# Patient Record
Sex: Male | Born: 1955 | Race: White | Hispanic: No | Marital: Single | Smoking: Former smoker
Health system: Southern US, Community
[De-identification: ages and names within clinical notes are randomized; demographics above are authoritative.]

## PROBLEM LIST (undated history)

## (undated) DIAGNOSIS — N289 Disorder of kidney and ureter, unspecified: Secondary | ICD-10-CM

## (undated) DIAGNOSIS — M459 Ankylosing spondylitis of unspecified sites in spine: Secondary | ICD-10-CM

## (undated) HISTORY — PX: OTHER SURGICAL HISTORY: SHX169

---

## 2005-07-25 ENCOUNTER — Ambulatory Visit (HOSPITAL_COMMUNITY): Admission: RE | Admit: 2005-07-25 | Discharge: 2005-07-25 | Payer: Self-pay | Admitting: Urology

## 2005-08-02 ENCOUNTER — Ambulatory Visit (HOSPITAL_COMMUNITY): Admission: RE | Admit: 2005-08-02 | Discharge: 2005-08-02 | Payer: Self-pay | Admitting: Urology

## 2010-07-10 ENCOUNTER — Emergency Department (HOSPITAL_COMMUNITY)
Admission: EM | Admit: 2010-07-10 | Discharge: 2010-07-10 | Disposition: A | Discharge status: Home / Self Care | Payer: Self-pay | Attending: Emergency Medicine | Admitting: Emergency Medicine

## 2010-07-10 ENCOUNTER — Emergency Department (HOSPITAL_COMMUNITY): Payer: Self-pay

## 2010-07-10 DIAGNOSIS — R059 Cough, unspecified: Secondary | ICD-10-CM | POA: Insufficient documentation

## 2010-07-10 DIAGNOSIS — G8929 Other chronic pain: Secondary | ICD-10-CM | POA: Insufficient documentation

## 2010-07-10 DIAGNOSIS — R002 Palpitations: Secondary | ICD-10-CM | POA: Insufficient documentation

## 2010-07-10 DIAGNOSIS — R42 Dizziness and giddiness: Secondary | ICD-10-CM | POA: Insufficient documentation

## 2010-07-10 DIAGNOSIS — R05 Cough: Secondary | ICD-10-CM | POA: Insufficient documentation

## 2010-07-10 DIAGNOSIS — R079 Chest pain, unspecified: Secondary | ICD-10-CM | POA: Insufficient documentation

## 2010-07-10 DIAGNOSIS — M549 Dorsalgia, unspecified: Secondary | ICD-10-CM | POA: Insufficient documentation

## 2010-07-10 DIAGNOSIS — F411 Generalized anxiety disorder: Secondary | ICD-10-CM | POA: Insufficient documentation

## 2010-07-10 LAB — BASIC METABOLIC PANEL
BUN: 14 mg/dL (ref 6–23)
CO2: 25 mEq/L (ref 19–32)
Chloride: 106 mEq/L (ref 96–112)
GFR calc Af Amer: >60 mL/min (ref >60)
GFR calc non Af Amer: >60 mL/min (ref >60)
Glucose, Bld: 92 mg/dL (ref 70–99)
Potassium: 3.7 mEq/L (ref 3.5–5.1)
Sodium: 138 mEq/L (ref 135–145)

## 2010-07-10 LAB — CBC
HCT: 45.5 % (ref 39.0–52.0)
MCV: 85.7 fL (ref 78.0–100.0)
Platelets: 237 10*3/uL (ref 150–400)
RDW: 13.7 % (ref 11.5–15.5)

## 2010-07-10 LAB — DIFFERENTIAL
Basophils Absolute: 0 10*3/uL (ref 0.0–0.1)
Eosinophils Absolute: 0.1 10*3/uL (ref 0.0–0.7)
Eosinophils Relative: 1 % (ref 0–5)
Lymphocytes Relative: 34 % (ref 12–46)
Monocytes Relative: 10 % (ref 3–12)
Neutrophils Relative %: 55 % (ref 43–77)

## 2010-07-10 LAB — POCT CARDIAC MARKERS: Troponin i, poc: <0.05 ng/mL (ref 0.00–0.09)

## 2010-07-20 ENCOUNTER — Emergency Department (HOSPITAL_COMMUNITY): Payer: Self-pay

## 2010-07-20 ENCOUNTER — Emergency Department (HOSPITAL_COMMUNITY)
Admission: EM | Admit: 2010-07-20 | Discharge: 2010-07-20 | Disposition: A | Discharge status: Home / Self Care | Payer: Self-pay | Attending: Emergency Medicine | Admitting: Emergency Medicine

## 2010-07-20 DIAGNOSIS — R112 Nausea with vomiting, unspecified: Secondary | ICD-10-CM | POA: Insufficient documentation

## 2010-07-20 DIAGNOSIS — Z79899 Other long term (current) drug therapy: Secondary | ICD-10-CM | POA: Insufficient documentation

## 2010-07-20 DIAGNOSIS — N23 Unspecified renal colic: Secondary | ICD-10-CM | POA: Insufficient documentation

## 2010-07-20 LAB — URINALYSIS, ROUTINE W REFLEX MICROSCOPIC
Bilirubin Urine: NEGATIVE
Ketones, ur: NEGATIVE mg/dL
Urobilinogen, UA: 0.2 mg/dL (ref 0.0–1.0)
pH: 5.5 (ref 5.0–8.0)

## 2010-07-20 LAB — BASIC METABOLIC PANEL
BUN: 16 mg/dL (ref 6–23)
Calcium: 9.6 mg/dL (ref 8.4–10.5)
Chloride: 104 mEq/L (ref 96–112)
Creatinine, Ser: 1.52 mg/dL — ABNORMAL HIGH (ref 0.4–1.5)
GFR calc non Af Amer: 48 mL/min — ABNORMAL LOW (ref >60)
Glucose, Bld: 99 mg/dL (ref 70–99)

## 2010-07-20 LAB — DIFFERENTIAL
Eosinophils Relative: 1 % (ref 0–5)
Monocytes Absolute: 1.4 10*3/uL — ABNORMAL HIGH (ref 0.1–1.0)
Neutrophils Relative %: 65 % (ref 43–77)

## 2010-07-20 LAB — CBC
HCT: 42.4 % (ref 39.0–52.0)
MCV: 85 fL (ref 78.0–100.0)
RBC: 4.99 MIL/uL (ref 4.22–5.81)

## 2010-07-20 LAB — URINE MICROSCOPIC-ADD ON

## 2015-04-02 ENCOUNTER — Encounter (HOSPITAL_COMMUNITY): Payer: Self-pay | Admitting: Emergency Medicine

## 2015-04-02 ENCOUNTER — Emergency Department (HOSPITAL_COMMUNITY): Payer: Medicare Other

## 2015-04-02 ENCOUNTER — Observation Stay (HOSPITAL_COMMUNITY)
Admission: EM | Admit: 2015-04-02 | Discharge: 2015-04-03 | Disposition: A | Discharge status: Home / Self Care | Payer: Medicare Other | Attending: Internal Medicine | Admitting: Internal Medicine

## 2015-04-02 DIAGNOSIS — G8929 Other chronic pain: Secondary | ICD-10-CM | POA: Insufficient documentation

## 2015-04-02 DIAGNOSIS — Z87442 Personal history of urinary calculi: Secondary | ICD-10-CM | POA: Insufficient documentation

## 2015-04-02 DIAGNOSIS — F329 Major depressive disorder, single episode, unspecified: Secondary | ICD-10-CM | POA: Diagnosis not present

## 2015-04-02 DIAGNOSIS — F32A Depression, unspecified: Secondary | ICD-10-CM | POA: Diagnosis present

## 2015-04-02 DIAGNOSIS — F1010 Alcohol abuse, uncomplicated: Secondary | ICD-10-CM | POA: Diagnosis present

## 2015-04-02 DIAGNOSIS — G4733 Obstructive sleep apnea (adult) (pediatric): Secondary | ICD-10-CM | POA: Diagnosis not present

## 2015-04-02 DIAGNOSIS — J189 Pneumonia, unspecified organism: Principal | ICD-10-CM | POA: Diagnosis present

## 2015-04-02 DIAGNOSIS — Z888 Allergy status to other drugs, medicaments and biological substances status: Secondary | ICD-10-CM | POA: Insufficient documentation

## 2015-04-02 DIAGNOSIS — M549 Dorsalgia, unspecified: Secondary | ICD-10-CM | POA: Insufficient documentation

## 2015-04-02 DIAGNOSIS — Z87891 Personal history of nicotine dependence: Secondary | ICD-10-CM | POA: Insufficient documentation

## 2015-04-02 HISTORY — DX: Disorder of kidney and ureter, unspecified: N28.9

## 2015-04-02 HISTORY — DX: Ankylosing spondylitis of unspecified sites in spine: M45.9

## 2015-04-02 LAB — COMPREHENSIVE METABOLIC PANEL
ALT: 20 U/L (ref 17–63)
ANION GAP: 7 (ref 5–15)
AST: 20 U/L (ref 15–41)
Albumin: 2.9 g/dL — ABNORMAL LOW (ref 3.5–5.0)
Alkaline Phosphatase: 56 U/L (ref 38–126)
BUN: 16 mg/dL (ref 6–20)
CHLORIDE: 103 mmol/L (ref 101–111)
CO2: 29 mmol/L (ref 22–32)
CREATININE: 0.9 mg/dL (ref 0.61–1.24)
Calcium: 8.4 mg/dL — ABNORMAL LOW (ref 8.9–10.3)
Glucose, Bld: 104 mg/dL — ABNORMAL HIGH (ref 65–99)
POTASSIUM: 3.6 mmol/L (ref 3.5–5.1)
SODIUM: 139 mmol/L (ref 135–145)
Total Bilirubin: 0.5 mg/dL (ref 0.3–1.2)
Total Protein: 6.3 g/dL — ABNORMAL LOW (ref 6.5–8.1)

## 2015-04-02 LAB — RAPID URINE DRUG SCREEN, HOSP PERFORMED
Amphetamines: NOT DETECTED
BARBITURATES: NOT DETECTED
BENZODIAZEPINES: POSITIVE — AB
COCAINE: NOT DETECTED
Opiates: POSITIVE — AB
TETRAHYDROCANNABINOL: NOT DETECTED

## 2015-04-02 LAB — CBC WITH DIFFERENTIAL/PLATELET
Basophils Absolute: 0 10*3/uL (ref 0.0–0.1)
Basophils Relative: 0 %
EOS ABS: 0.1 10*3/uL (ref 0.0–0.7)
EOS PCT: 1 %
HCT: 31.6 % — ABNORMAL LOW (ref 39.0–52.0)
Hemoglobin: 10.3 g/dL — ABNORMAL LOW (ref 13.0–17.0)
LYMPHS ABS: 1.9 10*3/uL (ref 0.7–4.0)
LYMPHS PCT: 19 %
MCH: 28.1 pg (ref 26.0–34.0)
MCHC: 32.6 g/dL (ref 30.0–36.0)
MCV: 86.3 fL (ref 78.0–100.0)
MONO ABS: 1.1 10*3/uL — AB (ref 0.1–1.0)
MONOS PCT: 11 %
Neutro Abs: 6.7 10*3/uL (ref 1.7–7.7)
Neutrophils Relative %: 69 %
PLATELETS: 176 10*3/uL (ref 150–400)
RBC: 3.66 MIL/uL — AB (ref 4.22–5.81)
RDW: 13.4 % (ref 11.5–15.5)
WBC: 9.8 10*3/uL (ref 4.0–10.5)

## 2015-04-02 LAB — BLOOD GAS, ARTERIAL
Acid-Base Excess: 3 mmol/L — ABNORMAL HIGH (ref 0.0–2.0)
BICARBONATE: 26.8 meq/L — AB (ref 20.0–24.0)
Drawn by: 23534
FIO2: 0.21
O2 Content: 21 L/min
O2 Saturation: 92.1 %
PCO2 ART: 43.9 mmHg (ref 35.0–45.0)
PH ART: 7.41 (ref 7.350–7.450)
pO2, Arterial: 64.5 mmHg — ABNORMAL LOW (ref 80.0–100.0)

## 2015-04-02 MED ORDER — ENOXAPARIN SODIUM 40 MG/0.4ML ~~LOC~~ SOLN
40.0000 mg | SUBCUTANEOUS | Status: DC
  Administered 2015-04-02: 40 mg via SUBCUTANEOUS
  Filled 2015-04-02: qty 0.4

## 2015-04-02 MED ORDER — BUSPIRONE HCL 5 MG PO TABS
10.0000 mg | ORAL_TABLET | Freq: Three times a day (TID) | ORAL | Status: DC
Start: 2015-04-02 — End: 2015-04-03
  Filled 2015-04-02 (×6): qty 1

## 2015-04-02 MED ORDER — LEVOFLOXACIN IN D5W 500 MG/100ML IV SOLN
500.0000 mg | Freq: Once | INTRAVENOUS | Status: AC
  Administered 2015-04-02: 500 mg via INTRAVENOUS
  Filled 2015-04-02: qty 100

## 2015-04-02 MED ORDER — THIAMINE HCL 100 MG/ML IJ SOLN: 100.0000 mg | Freq: Every day | INTRAMUSCULAR | Status: DC

## 2015-04-02 MED ORDER — AZITHROMYCIN 250 MG PO TABS: 500.0000 mg | ORAL_TABLET | Freq: Once | ORAL | Status: DC

## 2015-04-02 MED ORDER — LORAZEPAM 1 MG PO TABS
0.0000 mg | ORAL_TABLET | Freq: Four times a day (QID) | ORAL | Status: DC
  Administered 2015-04-02 – 2015-04-03 (×4): 1 mg via ORAL
  Filled 2015-04-02 (×3): qty 1

## 2015-04-02 MED ORDER — LORAZEPAM 1 MG PO TABS: 0.0000 mg | ORAL_TABLET | Freq: Two times a day (BID) | ORAL | Status: DC

## 2015-04-02 MED ORDER — VITAMIN B-1 100 MG PO TABS
100.0000 mg | ORAL_TABLET | Freq: Every day | ORAL | Status: DC
  Administered 2015-04-02 – 2015-04-03 (×2): 100 mg via ORAL
  Filled 2015-04-02 (×2): qty 1

## 2015-04-02 MED ORDER — LORAZEPAM 1 MG PO TABS
1.0000 mg | ORAL_TABLET | Freq: Four times a day (QID) | ORAL | Status: DC | PRN
  Filled 2015-04-02: qty 1

## 2015-04-02 MED ORDER — ADULT MULTIVITAMIN W/MINERALS CH
1.0000 | ORAL_TABLET | Freq: Every day | ORAL | Status: DC
  Administered 2015-04-02 – 2015-04-03 (×2): 1 via ORAL
  Filled 2015-04-02 (×2): qty 1

## 2015-04-02 MED ORDER — LORAZEPAM 2 MG/ML IJ SOLN
1.0000 mg | Freq: Four times a day (QID) | INTRAMUSCULAR | Status: DC | PRN
Start: 2015-04-02 — End: 2015-04-03

## 2015-04-02 MED ORDER — ALBUTEROL SULFATE (2.5 MG/3ML) 0.083% IN NEBU: 2.5000 mg | INHALATION_SOLUTION | RESPIRATORY_TRACT | Status: AC | PRN

## 2015-04-02 MED ORDER — FOLIC ACID 1 MG PO TABS
1.0000 mg | ORAL_TABLET | Freq: Every day | ORAL | Status: DC
  Administered 2015-04-02 – 2015-04-03 (×2): 1 mg via ORAL
  Filled 2015-04-02 (×2): qty 1

## 2015-04-02 MED ORDER — OXYCODONE HCL 5 MG PO TABS
30.0000 mg | ORAL_TABLET | Freq: Four times a day (QID) | ORAL | Status: DC | PRN
Start: 2015-04-02 — End: 2015-04-03
  Administered 2015-04-02 – 2015-04-03 (×2): 30 mg via ORAL
  Filled 2015-04-02 (×2): qty 6

## 2015-04-02 NOTE — ED Provider Notes (Signed)
CSN: [id number]     Arrival date & time 04/02/15  [id number] History   First MD Initiated Contact with Patient 04/02/15 1011     Chief Complaint  Patient presents with  . Pneumonia     (Consider location/radiation/quality/duration/timing/severity/associated sxs/prior Treatment) Patient is a 60 y.o. male presenting with pneumonia. The history is provided by the patient. No language interpreter was used.  Pneumonia This is a new problem. The current episode started in the past 7 days. The problem occurs constantly. The problem has been gradually worsening. He has tried nothing for the symptoms. The treatment provided moderate relief.   Pt was admitted at [facility] on 1/12 due to pneumonia.  Pt has been on antibiotics.  Pt left today.  (AMA) Pt states he just decided to leave.  Pt has a history of substance abuse, alcohol abuse and mental illness.  Family brought pt here. Past Medical History  Diagnosis Date  . Renal disorder     kidney stones  . Ankylosing spondylitis (HCC)    History reviewed. No pertinent past surgical history. No family history on file. Social History  Substance Use Topics  . Smoking status: Former Smoker    Types: Cigarettes  . Smokeless tobacco: None  . Alcohol Use: No    Review of Systems  All other systems reviewed and are negative.     Allergies  Fentanyl  Home Medications   Prior to Admission medications   Not on File   BP 134/72 mmHg  Pulse 77  Temp(Src) 98.1 F (36.7 C) (Oral)  Resp 18  Ht 6' (1.829 m)  Wt 87.998 kg  BMI 26.31 kg/m2  SpO2 96% Physical Exam  Constitutional: He is oriented to person, place, and time. He appears well-developed and well-nourished.  HENT:  Head: Normocephalic.  Right Ear: External ear normal.  Left Ear: External ear normal.  Nose: Nose normal.  Mouth/Throat: Oropharynx is clear and moist.  Eyes: Conjunctivae and EOM are normal. Pupils are equal, round, and reactive to light.  Neck: Normal range of motion.  Neck supple.  Cardiovascular: Normal rate and normal heart sounds.   Pulmonary/Chest:  Rhonchi  No wheezing  Abdominal: Soft.  Musculoskeletal: Normal range of motion.  Neurological: He is alert and oriented to person, place, and time. He has normal reflexes.  Skin: Skin is warm.  Psychiatric: He has a normal mood and affect.  Nursing note and vitals reviewed.   ED Course  Procedures (including critical care time) Labs Review Labs Reviewed  CBC WITH DIFFERENTIAL/PLATELET - Abnormal; Notable for the following:    RBC 3.66 (*)    Hemoglobin 10.3 (*)    HCT 31.6 (*)    Monocytes Absolute 1.1 (*)    All other components within normal limits  COMPREHENSIVE METABOLIC PANEL - Abnormal; Notable for the following:    Glucose, Bld 104 (*)    Calcium 8.4 (*)    Total Protein 6.3 (*)    Albumin 2.9 (*)    All other components within normal limits    Imaging Review Dg Chest 2 View  04/02/2015  CLINICAL DATA:  Pt reports being diagnosed with pneumonia this morning at [facility]. Reports on-going productive cough. Denies SOB. Denies any pain EXAM: CHEST  2 VIEW COMPARISON:  Radiograph 03/31/2015 FINDINGS: Normal cardiac silhouette. There is patchy and nodular RIGHT upper lobe and RIGHT lower lobe airspace opacities which have worsened compared to prior. There is new nodularity in the RIGHT upper lobe. Mild LEFT perihilar airspace disease is  unchanged. IMPRESSION: Progressive nodular and patchy airspace disease the RIGHT lung suggest worsening pneumonia. Electronically Signed   By: Genevive Bi M.D.   On: 04/02/2015 11:15   I have personally reviewed and evaluated these images and lab results as part of my medical decision-making.   EKG Interpretation None      MDM Pt has worsening Pneumonia. Pt told nurse he was going home.   Records obtained from [facility] and reviewed.  Pt's family reports pt left ama because of psychiatric issues.  Family feels pt is worse.  After discussing with family  pt agrees to stay.   Final diagnoses:  Community acquired pneumonia    I discussed with Dr. Rito Ehrlich who will admit.    Lonia Skinner [place], PA-C 04/02/15 1417  Donnetta Hutching, MD 04/03/15 [id number]

## 2015-04-02 NOTE — H&P (Signed)
History and Physical  Jimmy Osborne WJX:[id number] DOB: 1956-01-25 DOA: 04/02/2015  Referring physician: Langston Masker, ER PA  PCP: No primary care provider on file.   Chief Complaint: Pneumonia   HPI: Jimmy Osborne is a 60 y.o. male  Past medical history of cigarette of alcohol abuse and chronic back pain who was admitted at [facility] on 1/12 for pneumonia. Patient became angry and left AMA after 1 day of treatment. He came here to the emergency room today 1/14 after family felt like his breathing was labored. Chest x-ray compared to film done on admission there noted worsening pneumonia, although oxygen saturations at 95% on room air and the labs including white blood cell count normal. Hospitalist call for further evaluation   Review of Systems:  Patient seen after arrival to floor . Pt denies any complaints other than chronic back pain.  Pt denies any headaches, vision changes, dysphagia, chest pain, palpitations, shortness of breath, wheeze, cough, abdominal pain, hematuria, dysuria, constipation, diarrhea, focal extremity numbness weakness or pain .  Review of systems are otherwise negative  Past Medical History  Diagnosis Date  . Renal disorder     kidney stones  . Ankylosing spondylitis (HCC)    History reviewed. No pertinent past surgical history. Social History:  reports that he has quit smoking. His smoking use included Cigarettes. He does not have any smokeless tobacco history on file. He reports that he does not drink alcohol or use illicit drugs. patient's wife privately told me that he drinks quite heavily and drinks several bottles of wine a day. His last drink was late Thursday. Patient lives at home with his wife and children  & is able to participate in activities of daily living without use of the walker or cane  Allergies  Allergen Reactions  . Fentanyl Other (See Comments)    jittery    Family history: Depression  Prior to Admission medications     Medication Sig Start Date End Date Taking? Authorizing Provider  busPIRone (BUSPAR) 10 MG tablet Take 10 mg by mouth 3 (three) times daily.   Yes Historical Provider, MD  Fish Oil-Cholecalciferol (FISH OIL + D3 PO) Take 2 capsules by mouth daily.   Yes Historical Provider, MD  hydrOXYzine (VISTARIL) 50 MG capsule Take 50 mg by mouth 3 (three) times daily as needed.   Yes Historical Provider, MD  Multiple Vitamin (MULTIVITAMIN WITH MINERALS) TABS tablet Take 1 tablet by mouth daily.   Yes Historical Provider, MD  oxycodone (ROXICODONE) 30 MG immediate release tablet Take 30 mg by mouth every 6 (six) hours as needed for pain.   Yes Historical Provider, MD  vitamin C (ASCORBIC ACID) 500 MG tablet Take 500 mg by mouth daily.   Yes Historical Provider, MD    Physical Exam: BP 116/66 mmHg  Pulse 74  Temp(Src) 98.2 F (36.8 C) (Oral)  Resp 20  Ht 6' (1.829 m)  Wt 93.441 kg (206 lb)  BMI 27.93 kg/m2  SpO2 95%  General:  Alert and oriented 3, although at times nods off Eyes: Sclera nonicteric, extraocular movements are intact  ENT: Normocephalic, atraumatic, mucous memories are dry  Neck: No JVD  Cardiovascular: Regular rate and rhythm, S1-S2  Respiratory: Decreased breath sounds bibasilar, with prolonged expiratory phase  Abdomen: Soft, nontender, nondistended, positive bowel sounds  Skin: No skin breaks, tears or lesions  Musculoskeletal: No clubbing or cyanosis, trace edema  Psychiatric: Patient is appropriate, no evidence of psychoses  Neurologic: No focal deficits  Labs on Admission:  Basic Metabolic Panel:  Recent Labs Lab 04/02/15 1044  NA 139  K 3.6  CL 103  CO2 29  GLUCOSE 104*  BUN 16  CREATININE 0.90  CALCIUM 8.4*   Liver Function Tests:  Recent Labs Lab 04/02/15 1044  AST 20  ALT 20  ALKPHOS 56  BILITOT 0.5  PROT 6.3*  ALBUMIN 2.9*   No results for input(s): LIPASE, AMYLASE in the last 168 hours. No results for input(s): AMMONIA in the last  168 hours. CBC:  Recent Labs Lab 04/02/15 1044  WBC 9.8  NEUTROABS 6.7  HGB 10.3*  HCT 31.6*  MCV 86.3  PLT 176   Cardiac Enzymes: No results for input(s): CKTOTAL, CKMB, CKMBINDEX, TROPONINI in the last 168 hours.  BNP (last 3 results) No results for input(s): BNP in the last 8760 hours.  ProBNP (last 3 results) No results for input(s): PROBNP in the last 8760 hours.  CBG: No results for input(s): GLUCAP in the last 168 hours.  Radiological Exams on Admission: Dg Chest 2 View  04/02/2015  CLINICAL DATA:  Pt reports being diagnosed with pneumonia this [facility] at [facility]. Reports on-going productive cough. Denies SOB. Denies any pain EXAM: CHEST  2 VIEW COMPARISON:  Radiograph 03/31/2015 FINDINGS: Normal cardiac silhouette. There is patchy and nodular RIGHT upper lobe and RIGHT lower lobe airspace opacities which have worsened compared to prior. There is new nodularity in the RIGHT upper lobe. Mild LEFT perihilar airspace disease is unchanged. IMPRESSION: Progressive nodular and patchy airspace disease the RIGHT lung suggest worsening pneumonia. Electronically Signed   By: Genevive BiStewart  Edmunds M.D.   On: 04/02/2015 11:15    EKG: Not done  Assessment/Plan Present on Admission:  . CAP (community acquired pneumonia): Worsening finding on x-ray likely more of a delay. Nevertheless, we'll try Levaquin. The patient truly is not alcoholic many of this is aspiration, Levaquin should cover this as well. Oxygen saturation stable. Given that we are continuing his home pain medications plus Ativan protocol, continuous pulse ox. Check ABG given concerns of confusion  . OSA (obstructive sleep apnea): Patient at times will sleep and then gasping when he stops breathing. Sounds very much like classic undiagnosed sleep apnea. Refer for sleep study. Patient amenable to trying CPAP here.  . Alcohol abuse: According to patient's wife, patient drinks heavily several bottles of wine a day. His last drink  was late Thursday evening. Will actively plan for protocol. CIWA . Given reports of potential other abuse of drugs, will check urine drug screen  . Depression: Continue BuSpar   Chronic back pain: Continue home medications  Consultants: None    Code Status: Full code    Family Communication: Wife at the bedside     Disposition Plan: Potential discharge in next few days    Time spent: 40 minutes    Hollice EspyKRISHNAN,SENDIL K [facility] Pager [phone]

## 2015-04-02 NOTE — ED Notes (Signed)
Pt reports being [facility] with [facility] at [facility]. Pt states he left AMA due to feeling like they were not caring for him appropriately. Reports on-going productive cough. Denies SOB. Denies any pain.

## 2015-04-02 NOTE — Progress Notes (Signed)
Placed patient on nasal mask ,CPAP 9 auto titrate. Room air. Appears to tolerate well.

## 2015-04-02 NOTE — ED Notes (Signed)
Report given to floor nurse, all questions answered  

## 2015-04-03 DIAGNOSIS — F1010 Alcohol abuse, uncomplicated: Secondary | ICD-10-CM

## 2015-04-03 DIAGNOSIS — J189 Pneumonia, unspecified organism: Secondary | ICD-10-CM | POA: Diagnosis not present

## 2015-04-03 LAB — CBC
HCT: 32.1 % — ABNORMAL LOW (ref 39.0–52.0)
Hemoglobin: 10.7 g/dL — ABNORMAL LOW (ref 13.0–17.0)
MCH: 28.2 pg (ref 26.0–34.0)
MCHC: 33.3 g/dL (ref 30.0–36.0)
MCV: 84.7 fL (ref 78.0–100.0)
PLATELETS: 195 10*3/uL (ref 150–400)
RBC: 3.79 MIL/uL — ABNORMAL LOW (ref 4.22–5.81)
RDW: 13.2 % (ref 11.5–15.5)
WBC: 8.1 10*3/uL (ref 4.0–10.5)

## 2015-04-03 LAB — BASIC METABOLIC PANEL
ANION GAP: 7 (ref 5–15)
BUN: 11 mg/dL (ref 6–20)
CALCIUM: 8.4 mg/dL — AB (ref 8.9–10.3)
CO2: 28 mmol/L (ref 22–32)
Chloride: 104 mmol/L (ref 101–111)
Creatinine, Ser: 0.76 mg/dL (ref 0.61–1.24)
GFR calc Af Amer: >60 mL/min (ref >60)
GLUCOSE: 119 mg/dL — AB (ref 65–99)
Potassium: 3.2 mmol/L — ABNORMAL LOW (ref 3.5–5.1)
Sodium: 139 mmol/L (ref 135–145)

## 2015-04-03 LAB — STREP PNEUMONIAE URINARY ANTIGEN: STREP PNEUMO URINARY ANTIGEN: NEGATIVE

## 2015-04-03 MED ORDER — FOLIC ACID 1 MG PO TABS: 1.0000 mg | ORAL_TABLET | Freq: Every day | ORAL | Status: AC

## 2015-04-03 MED ORDER — THIAMINE HCL 100 MG PO TABS: 100.0000 mg | ORAL_TABLET | Freq: Every day | ORAL | Status: DC

## 2015-04-03 MED ORDER — POTASSIUM CHLORIDE CRYS ER 20 MEQ PO TBCR
40.0000 meq | EXTENDED_RELEASE_TABLET | Freq: Once | ORAL | Status: AC
  Administered 2015-04-03: 40 meq via ORAL
  Filled 2015-04-03: qty 2

## 2015-04-03 MED ORDER — LEVOFLOXACIN 750 MG PO TABS: 750.0000 mg | ORAL_TABLET | Freq: Every day | ORAL | Status: DC

## 2015-04-03 NOTE — Progress Notes (Signed)
AVS reviewed with patient.  Verbalized understanding of discharge instructions, physician follow-up, and medications.  Patient's IV removed.  Site WNL.  Three prescriptions given to patient.  Patient's wife expressed concern about patient being discharged today.  Dr. Ardyth HarpsHernandez notified.  Dr. Ardyth HarpsHernandez called room and spoke to patient's wife.  Patient and patient's wife stated that, "They are ready to be discharged home."  Patient asked about CPAP machine.  Per Dr. Ardyth HarpsHernandez, patient will need a sleep study performed prior to insurance paying for the CPAP machine.  Patient can ask his physician about a sleep study.  Patient reports belongings intact and in possession at time of discharge.  Patient escorted by NT to main entrance for discharge.  Patient stable at time of discharge.

## 2015-04-03 NOTE — Discharge Summary (Signed)
Physician Discharge Summary  Jimmy Osborne [id number] DOB: [birth year] DOA: 04/02/2015  PCP: No primary care provider on file.  Admit date: 04/02/2015 Discharge date: 04/03/2015  Time spent: 45 minutes  Recommendations for Outpatient Follow-up:  -Will be discharged home today. -Advised to follow up with PCP in 2 weeks. -Repeat CXR in 4-6 weeks to ensure complete resolution of PNA.   Discharge Diagnoses:  Principal Problem:   CAP (community acquired pneumonia) Active Problems:   OSA (obstructive sleep apnea)   Alcohol abuse   Depression   Discharge Condition: Stable and improved  Filed Weights   04/02/15 1015 04/02/15 1452  Weight: 87.998 kg (194 lb) 93.441 kg (206 lb)    History of present illness:  As per Dr. Chancy MilroyS Krishnan 1/14: Jimmy Rankinsimothy W Haigler is a 60 y.o. male  Past medical history of cigarette of alcohol abuse and chronic back pain who was admitted at [facility] on 1/12 for pneumonia. Patient became angry and left AMA after 1 day of treatment. He came here to the emergency room today 1/14 after family felt like his breathing was labored. Chest x-ray compared to film done on admission there noted worsening pneumonia, although oxygen saturations at 95% on room air and the labs including white blood cell count normal. Hospitalist call for further evaluation  Hospital Course:   CAP -No oxygen requirements, no increased WOB. -Will DC home on a 7 day course of levaquin.  ETOH Abuse -Thiamine/folate -Counseling cessation provided. -No active withdrawals while in the hospital.  Procedures:  None   Consultations:  None  Discharge Instructions  Discharge Instructions    Increase activity slowly    Complete by:  As directed             Medication List    STOP taking these medications        oxycodone 30 MG immediate release tablet  Commonly known as:  ROXICODONE      TAKE these medications        busPIRone 10 MG tablet  Commonly known as:   BUSPAR  Take 10 mg by mouth 3 (three) times daily.     FISH OIL + D3 PO  Take 2 capsules by mouth daily.     folic acid 1 MG tablet  Commonly known as:  FOLVITE  Take 1 tablet (1 mg total) by mouth daily.     hydrOXYzine 50 MG capsule  Commonly known as:  VISTARIL  Take 50 mg by mouth 3 (three) times daily as needed.     levofloxacin 750 MG tablet  Commonly known as:  LEVAQUIN  Take 1 tablet (750 mg total) by mouth daily.     multivitamin with minerals Tabs tablet  Take 1 tablet by mouth daily.     thiamine 100 MG tablet  Take 1 tablet (100 mg total) by mouth daily.     vitamin C 500 MG tablet  Commonly known as:  ASCORBIC ACID  Take 500 mg by mouth daily.       Allergies  Allergen Reactions  . Fentanyl Other (See Comments)    jittery       Follow-up Information    Schedule an appointment as soon as possible for a visit in 2 weeks to follow up.   Why:  with your primary care provider       The results of significant diagnostics from this hospitalization (including imaging, microbiology, ancillary and laboratory) are listed below for reference.    Significant  Diagnostic Studies: Dg Chest 2 View  04/02/2015  CLINICAL DATA:  Pt reports being diagnosed with pneumonia this morning at [facility]. Reports on-going productive cough. Denies SOB. Denies any pain EXAM: CHEST  2 VIEW COMPARISON:  Radiograph 03/31/2015 FINDINGS: Normal cardiac silhouette. There is patchy and nodular RIGHT upper lobe and RIGHT lower lobe airspace opacities which have worsened compared to prior. There is new nodularity in the RIGHT upper lobe. Mild LEFT perihilar airspace disease is unchanged. IMPRESSION: Progressive nodular and patchy airspace disease the RIGHT lung suggest worsening pneumonia. Electronically Signed   By: Genevive Bi M.D.   On: 04/02/2015 11:15    Microbiology: Recent Results (from the past 240 hour(s))  Culture, blood (routine x 2) Call MD if unable to obtain prior to  antibiotics being given     Status: None (Preliminary result)   Collection Time: 04/02/15  4:45 PM  Result Value Ref Range Status   Specimen Description BLOOD LEFT ARM  Final   Special Requests BOTTLES DRAWN AEROBIC ONLY 6CC  Final   Culture NO GROWTH < 24 HOURS  Final   Report Status PENDING  Incomplete  Culture, blood (routine x 2) Call MD if unable to obtain prior to antibiotics being given     Status: None (Preliminary result)   Collection Time: 04/02/15  4:49 PM  Result Value Ref Range Status   Specimen Description LEFT ANTECUBITAL  Final   Special Requests BOTTLES DRAWN AEROBIC AND ANAEROBIC 6CC EACH  Final   Culture NO GROWTH < 24 HOURS  Final   Report Status PENDING  Incomplete     Labs: Basic Metabolic Panel:  Recent Labs Lab 04/02/15 1044 04/03/15 0645  NA 139 139  K 3.6 3.2*  CL 103 104  CO2 29 28  GLUCOSE 104* 119*  BUN 16 11  CREATININE 0.90 0.76  CALCIUM 8.4* 8.4*   Liver Function Tests:  Recent Labs Lab 04/02/15 1044  AST 20  ALT 20  ALKPHOS 56  BILITOT 0.5  PROT 6.3*  ALBUMIN 2.9*   No results for input(s): LIPASE, AMYLASE in the last 168 hours. No results for input(s): AMMONIA in the last 168 hours. CBC:  Recent Labs Lab 04/02/15 1044 04/03/15 0645  WBC 9.8 8.1  NEUTROABS 6.7  --   HGB 10.3* 10.7*  HCT 31.6* 32.1*  MCV 86.3 84.7  PLT 176 195   Cardiac Enzymes: No results for input(s): CKTOTAL, CKMB, CKMBINDEX, TROPONINI in the last 168 hours. BNP: BNP (last 3 results) No results for input(s): BNP in the last 8760 hours.  ProBNP (last 3 results) No results for input(s): PROBNP in the last 8760 hours.  CBG: No results for input(s): GLUCAP in the last 168 hours.     SignedChaya Jan  [facility] Pager: [phone] 04/03/2015, 1:07 PM

## 2015-04-04 LAB — HIV ANTIBODY (ROUTINE TESTING W REFLEX): HIV SCREEN 4TH GENERATION: NONREACTIVE

## 2015-04-07 LAB — CULTURE, BLOOD (ROUTINE X 2)
Culture: NO GROWTH
Culture: NO GROWTH

## 2015-05-09 ENCOUNTER — Other Ambulatory Visit (HOSPITAL_COMMUNITY): Payer: Self-pay | Admitting: *Deleted

## 2015-05-09 ENCOUNTER — Ambulatory Visit (HOSPITAL_COMMUNITY)
Admission: RE | Admit: 2015-05-09 | Discharge: 2015-05-09 | Disposition: A | Discharge status: Home / Self Care | Payer: Medicare Other | Source: Ambulatory Visit | Attending: *Deleted | Admitting: *Deleted

## 2015-05-09 DIAGNOSIS — J189 Pneumonia, unspecified organism: Secondary | ICD-10-CM | POA: Insufficient documentation

## 2016-05-20 IMAGING — DX DG CHEST 2V
2 series · 2 of 2 positions shown · non-contrast
Comparison: PA and lateral chest x-ray July 01, 2015

CLINICAL DATA: Follow-up of pneumonia, no current chest complaints,
history of previous tobacco use

EXAM:
CHEST  2 VIEW

[chest pa]
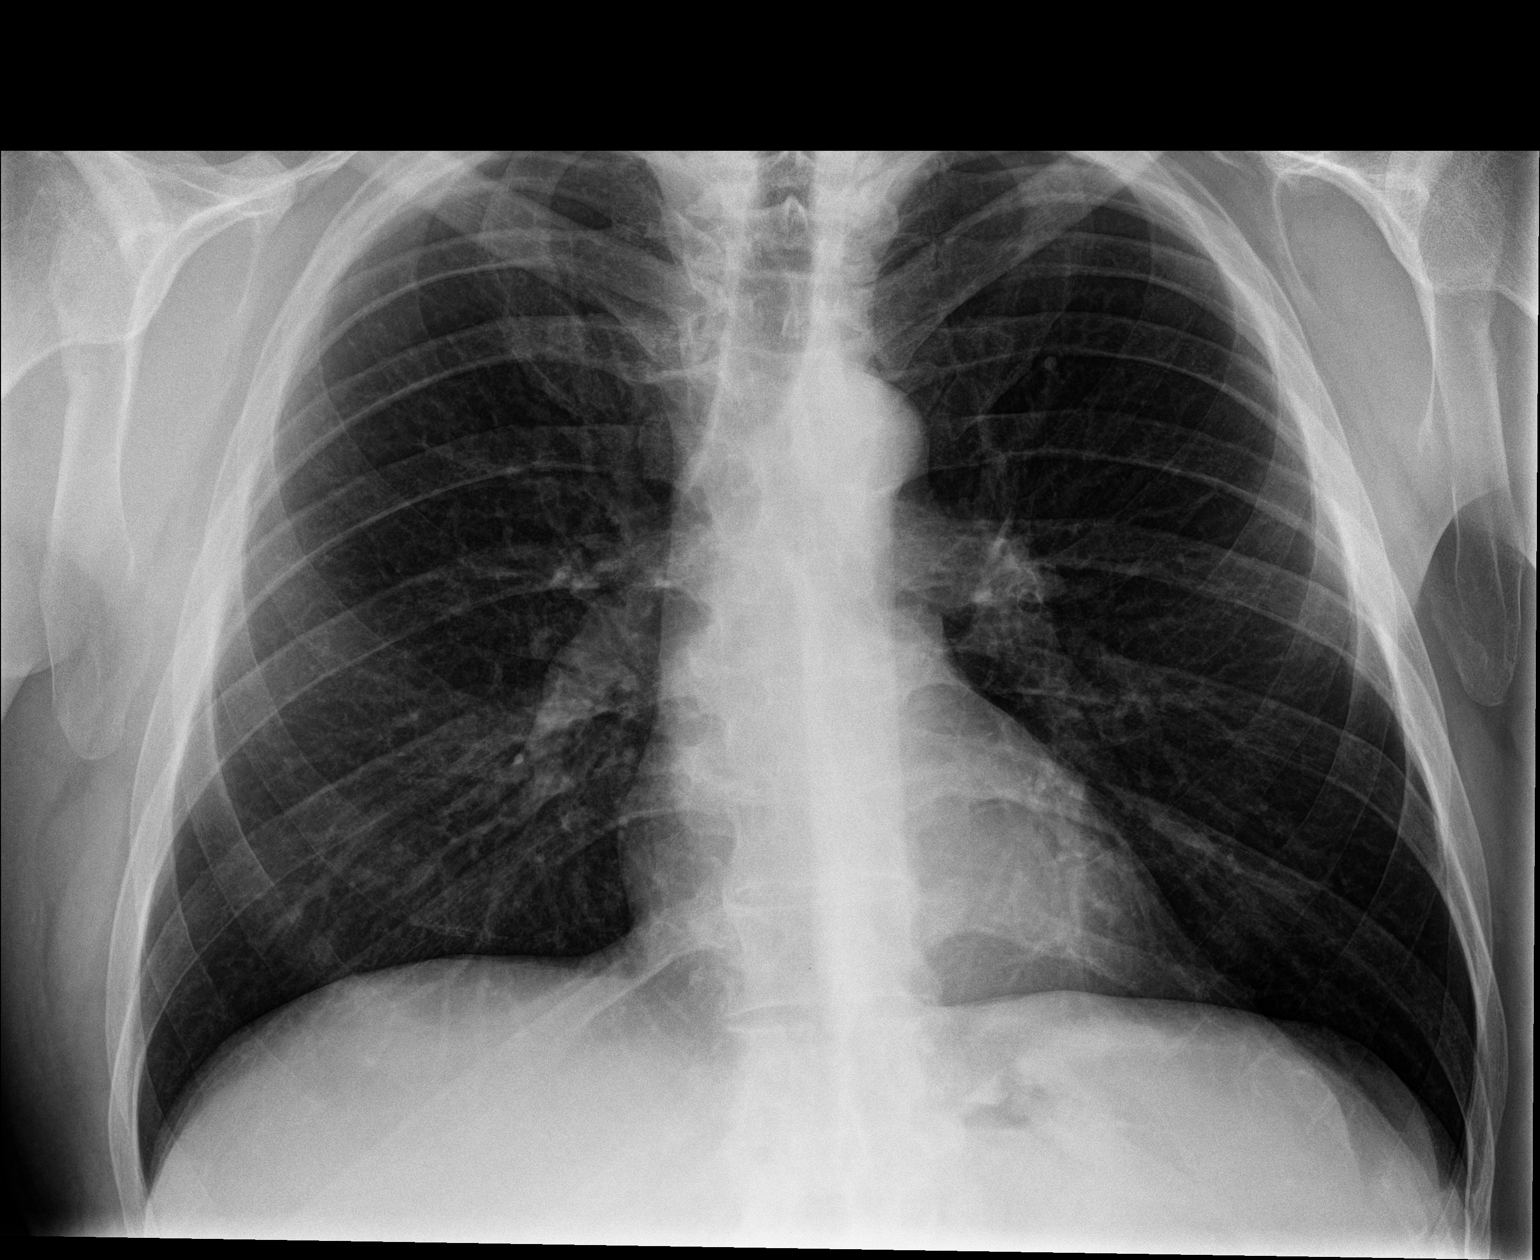

[chest lat]
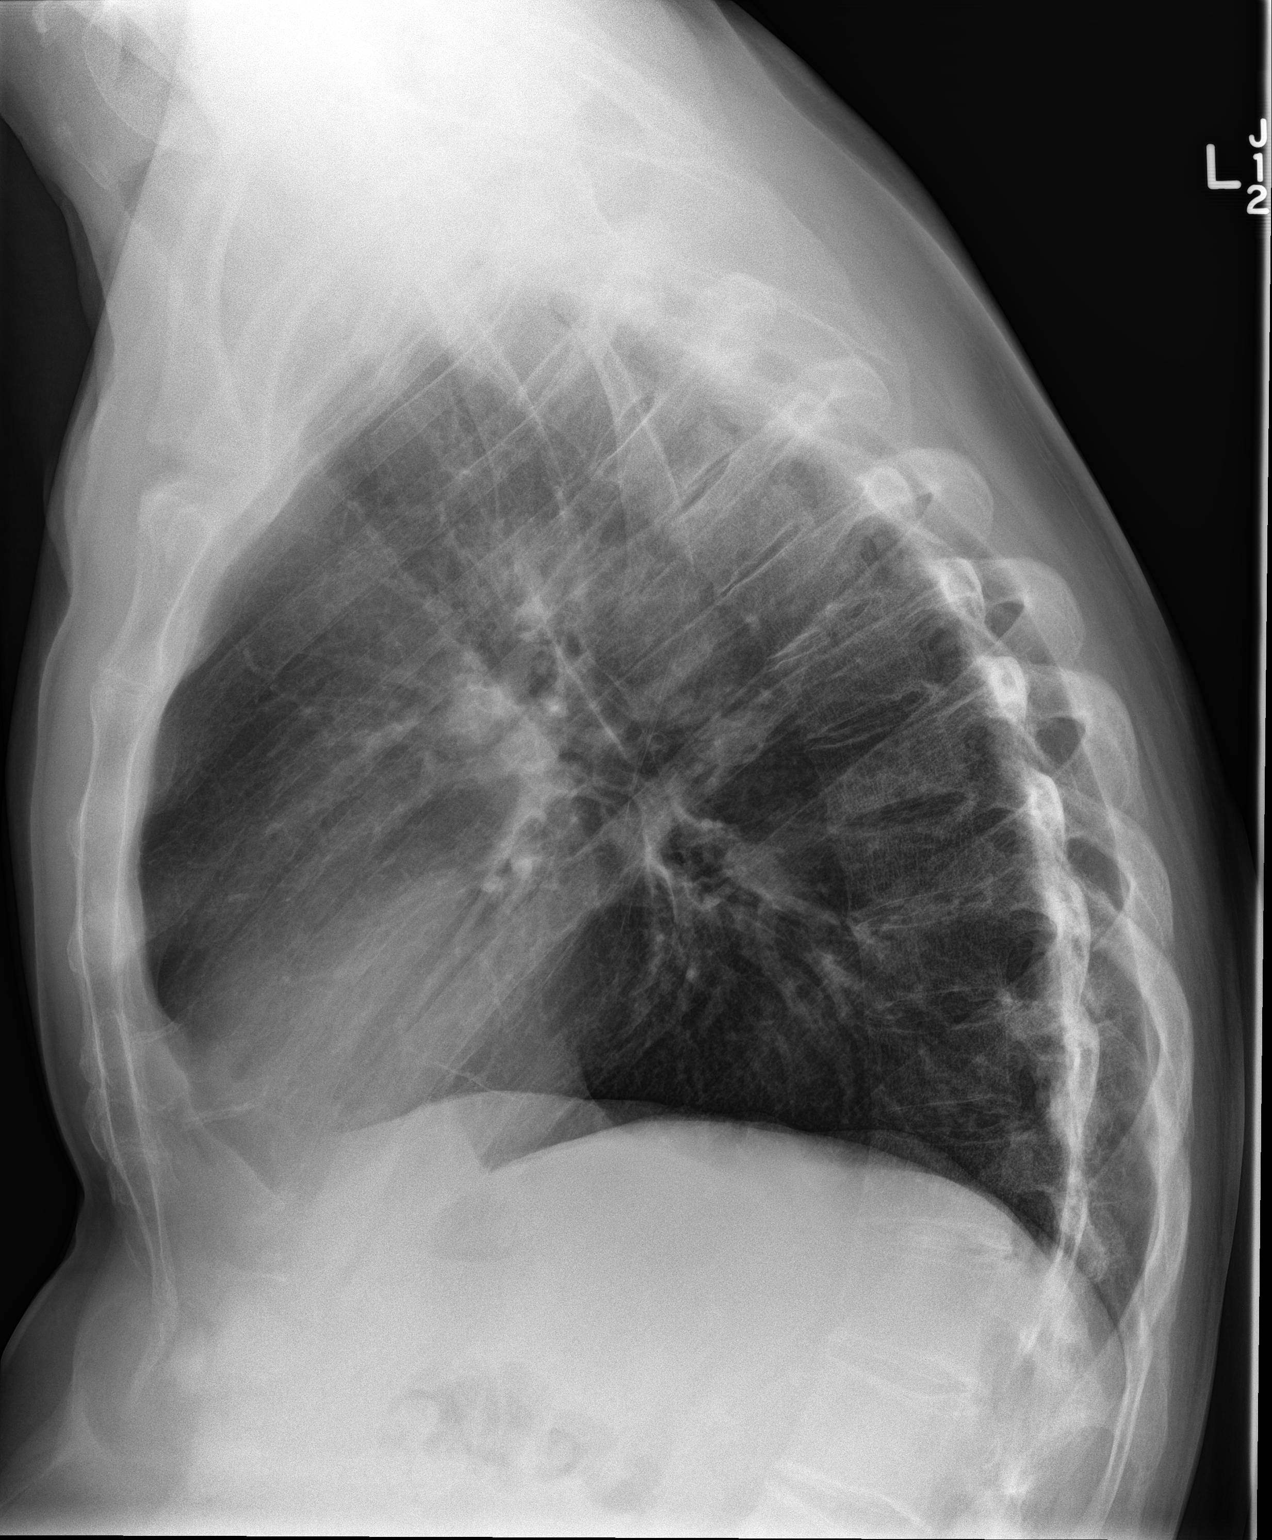

[2 of 2 positions shown; findings below may reference images not displayed]

FINDINGS: The lungs are mildly hyperinflated and clear. The previously
demonstrated interstitial and patchy alveolar infiltrates have
resolved. There is no pleural effusion or pneumothorax. The heart
and pulmonary vascularity are normal. The mediastinum is normal in
width. The bony thorax exhibits no acute abnormality.
IMPRESSION: Interval clearing of bilateral pneumonia. Chronic bronchitic changes
are stable.

## 2017-10-24 ENCOUNTER — Ambulatory Visit (INDEPENDENT_AMBULATORY_CARE_PROVIDER_SITE_OTHER): Payer: Medicare HMO | Admitting: Internal Medicine

## 2017-10-24 ENCOUNTER — Encounter (INDEPENDENT_AMBULATORY_CARE_PROVIDER_SITE_OTHER): Payer: Self-pay | Admitting: Internal Medicine

## 2017-10-24 VITALS — BP 140/80 | HR 60 | Temp 97.9°F | Ht 69.0 in | Wt 194.8 lb

## 2017-10-24 DIAGNOSIS — K219 Gastro-esophageal reflux disease without esophagitis: Secondary | ICD-10-CM

## 2017-10-24 DIAGNOSIS — K625 Hemorrhage of anus and rectum: Secondary | ICD-10-CM

## 2017-10-24 LAB — HEMOGLOBIN AND HEMATOCRIT, BLOOD
HEMATOCRIT: 38.1 % — AB (ref 38.5–50.0)
HEMOGLOBIN: 12.7 g/dL — AB (ref 13.2–17.1)

## 2017-10-24 MED ORDER — OMEPRAZOLE 40 MG PO CPDR: 40.0000 mg | DELAYED_RELEASE_CAPSULE | Freq: Every day | ORAL | 3 refills | Status: AC

## 2017-10-24 NOTE — Patient Instructions (Signed)
Three stool cards home with patient. H and H today.

## 2017-10-24 NOTE — Progress Notes (Signed)
   Subjective:    Patient ID: Jimmy Osborne, male    DOB: 29-Apr-1955, 62 y.o.   MRN: [id number]  HPI Referred by Dr. Marta AntuBrrino for rectal bleeding/bloating.  Two weeks ago he had foul gas and belches were strong. Symptoms lasted about 2 weeks.Denies prior incidence.   He was started on an antibiotic and Flexeril for this.  He says his symptoms are better. No belching now. He says his belching is normal now.  He occasionally has rectal bleeding occasionally. His colonoscopy was normal except for hemorrhoids in [facility] at [facility].  He says he is 80% better. He does have some acid reflux. Does not take any medication for this.  He occasionally sees rectal bleeding when he strains.       Review of Systems Past Medical History:  Diagnosis Date  . Ankylosing spondylitis (HCC)   . Renal disorder    kidney stones    History reviewed. No pertinent surgical history.  Allergies  Allergen Reactions  . Fentanyl Other (See Comments)    jittery    Current Outpatient Medications on File Prior to Visit  Medication Sig Dispense Refill  . cholecalciferol (VITAMIN D) 1000 units tablet Take by mouth daily.    . Fish Oil-Cholecalciferol (FISH OIL + D3 PO) Take 2 capsules by mouth daily.    . folic acid (FOLVITE) 1 MG tablet Take 1 tablet (1 mg total) by mouth daily.    . Multiple Vitamin (MULTIVITAMIN WITH MINERALS) TABS tablet Take 1 tablet by mouth daily.    . vitamin B-12 (CYANOCOBALAMIN) 100 MCG tablet Take 100 mcg by mouth daily.    . vitamin C (ASCORBIC ACID) 500 MG tablet Take 500 mg by mouth daily.     No current facility-administered medications on file prior to visit.         Objective:   Physical Exam Blood pressure 140/80, pulse 60, temperature 97.9 F (36.6 C), height 5\' 9"  (1.753 m), weight 194 lb 12.8 oz (88.4 kg). Alert and oriented. Skin warm and dry. Oral mucosa is moist.   . Sclera anicteric, conjunctivae is pink. Thyroid not enlarged. No cervical lymphadenopathy. Lungs  clear. Heart regular rate and rhythm.  Abdomen is soft. Bowel sounds are positive. No hepatomegaly. No abdominal masses felt. No tenderness.  No edema to lower extremities.           Assessment & Plan:  GERD, Belching. Am going to start him on Omeprazole.  He will have OV in 3 months.  3 stool cards sent home with patient.

## 2017-10-28 ENCOUNTER — Telehealth (INDEPENDENT_AMBULATORY_CARE_PROVIDER_SITE_OTHER): Payer: Self-pay | Admitting: Internal Medicine

## 2017-10-28 NOTE — Telephone Encounter (Signed)
Please sent PCP office visit notes

## 2017-10-28 NOTE — Telephone Encounter (Signed)
Note was faxed 10/25/17 

## 2017-11-01 ENCOUNTER — Telehealth (INDEPENDENT_AMBULATORY_CARE_PROVIDER_SITE_OTHER): Payer: Self-pay | Admitting: *Deleted

## 2017-11-01 NOTE — Telephone Encounter (Signed)
   Diagnosis:    Result(s)   Card 1:Negative: 10/24/2017    Card 2 Negative:10/25/2017   Card 3:Negative:08/10/20109    Completed by: Larose Hiresammy Lucero Auzenne , LPN   HEMOCCULT SENSA DEVELOPER: LOT#:  [id number] EXPIRATION DATE: 2021-11   HEMOCCULT SENSA CARD:  LOT#:  [id number] EXPIRATION DATE: 02/21   CARD CONTROL RESULTS:  POSITIVE: Positive NEGATIVE: Negative    ADDITIONAL COMMENTS: Results forwarded to Delrae Renderri Setzer,NP. Patient has not been called with the results.

## 2017-11-05 NOTE — Telephone Encounter (Signed)
No answer. Will set up for colonoscopy. Last colonoscopy was in 2012.

## 2017-11-07 ENCOUNTER — Other Ambulatory Visit (INDEPENDENT_AMBULATORY_CARE_PROVIDER_SITE_OTHER): Payer: Self-pay | Admitting: Internal Medicine

## 2017-11-07 ENCOUNTER — Telehealth (INDEPENDENT_AMBULATORY_CARE_PROVIDER_SITE_OTHER): Payer: Self-pay | Admitting: Internal Medicine

## 2017-11-07 DIAGNOSIS — K625 Hemorrhage of anus and rectum: Secondary | ICD-10-CM

## 2017-11-07 NOTE — Telephone Encounter (Signed)
Results given to patient. Last colonoscopy 2012.  Will schedule colonoscopy

## 2017-11-07 NOTE — Telephone Encounter (Signed)
Ann, colonoscopy. I have talked with patient 

## 2017-11-08 ENCOUNTER — Telehealth (INDEPENDENT_AMBULATORY_CARE_PROVIDER_SITE_OTHER): Payer: Self-pay | Admitting: *Deleted

## 2017-11-08 ENCOUNTER — Encounter (INDEPENDENT_AMBULATORY_CARE_PROVIDER_SITE_OTHER): Payer: Self-pay | Admitting: *Deleted

## 2017-11-08 DIAGNOSIS — K625 Hemorrhage of anus and rectum: Secondary | ICD-10-CM | POA: Insufficient documentation

## 2017-11-08 MED ORDER — SUPREP BOWEL PREP KIT 17.5-3.13-1.6 GM/177ML PO SOLN: 1.0000 | Freq: Once | ORAL | 0 refills | Status: AC

## 2017-11-08 NOTE — Telephone Encounter (Signed)
Patient needs suprep 

## 2017-11-08 NOTE — Telephone Encounter (Signed)
TCS sch'd 01/15/18, patient aware, instructions mailed

## 2018-01-15 ENCOUNTER — Ambulatory Visit (HOSPITAL_COMMUNITY): Admission: RE | Admit: 2018-01-15 | Payer: Medicare HMO | Source: Ambulatory Visit | Admitting: Internal Medicine

## 2018-01-15 ENCOUNTER — Encounter (HOSPITAL_COMMUNITY): Admission: RE | Payer: Self-pay | Source: Ambulatory Visit

## 2018-01-15 SURGERY — COLONOSCOPY
Anesthesia: Moderate Sedation

## 2018-01-28 ENCOUNTER — Ambulatory Visit (INDEPENDENT_AMBULATORY_CARE_PROVIDER_SITE_OTHER): Payer: Medicare HMO | Admitting: Internal Medicine

## 2018-02-10 ENCOUNTER — Other Ambulatory Visit (HOSPITAL_COMMUNITY): Payer: Self-pay | Admitting: Emergency Medicine

## 2018-02-10 DIAGNOSIS — M7989 Other specified soft tissue disorders: Principal | ICD-10-CM

## 2018-02-10 DIAGNOSIS — M79661 Pain in right lower leg: Secondary | ICD-10-CM

## 2018-02-11 ENCOUNTER — Other Ambulatory Visit (HOSPITAL_COMMUNITY): Payer: Self-pay | Admitting: Internal Medicine

## 2018-02-11 DIAGNOSIS — M79604 Pain in right leg: Secondary | ICD-10-CM

## 2018-02-17 ENCOUNTER — Encounter (HOSPITAL_COMMUNITY): Payer: Self-pay | Admitting: Emergency Medicine

## 2018-02-17 ENCOUNTER — Emergency Department (HOSPITAL_COMMUNITY)
Admission: EM | Admit: 2018-02-17 | Discharge: 2018-02-17 | Disposition: A | Discharge status: Home / Self Care | Payer: Medicare HMO | Attending: Emergency Medicine | Admitting: Emergency Medicine

## 2018-02-17 ENCOUNTER — Other Ambulatory Visit: Payer: Self-pay

## 2018-02-17 ENCOUNTER — Ambulatory Visit (HOSPITAL_COMMUNITY)
Admission: RE | Admit: 2018-02-17 | Discharge: 2018-02-17 | Disposition: A | Discharge status: Home / Self Care | Payer: Medicare HMO | Source: Ambulatory Visit | Attending: Internal Medicine | Admitting: Internal Medicine

## 2018-02-17 DIAGNOSIS — M79604 Pain in right leg: Secondary | ICD-10-CM | POA: Insufficient documentation

## 2018-02-17 DIAGNOSIS — R2241 Localized swelling, mass and lump, right lower limb: Secondary | ICD-10-CM | POA: Insufficient documentation

## 2018-02-17 DIAGNOSIS — Z5321 Procedure and treatment not carried out due to patient leaving prior to being seen by health care provider: Secondary | ICD-10-CM | POA: Diagnosis not present

## 2018-02-17 NOTE — ED Notes (Signed)
Patient left unit stating to front desk he will go to PCP instead.

## 2018-02-17 NOTE — ED Notes (Signed)
Called patient because he did not return to the waiting area. Pt states he came here for ultra sound and was checked into ED. But he did not [facility]. He called her family doctor and told them he [facility] and he was leaving to go there. Wife was with patient.

## 2018-02-17 NOTE — ED Notes (Signed)
Patient requested to move car from main entrance parking area.  Patient advised not to do so, patient insisted on moving car to ER entrance. .  Patient called from waiting room, not seen at this time.

## 2018-02-17 NOTE — ED Triage Notes (Signed)
Onset  A week ago, right leg pain and swelling. Pt has out patient Ultra sound here this morning. Positive of DVT.

## 2018-05-22 ENCOUNTER — Other Ambulatory Visit: Payer: Self-pay | Admitting: Internal Medicine

## 2018-05-22 DIAGNOSIS — M79604 Pain in right leg: Secondary | ICD-10-CM

## 2018-05-23 ENCOUNTER — Ambulatory Visit (HOSPITAL_COMMUNITY)
Admission: RE | Admit: 2018-05-23 | Discharge: 2018-05-23 | Disposition: A | Discharge status: Home / Self Care | Payer: Medicare HMO | Source: Ambulatory Visit | Attending: Internal Medicine | Admitting: Internal Medicine

## 2018-05-23 DIAGNOSIS — M79604 Pain in right leg: Secondary | ICD-10-CM | POA: Insufficient documentation

## 2018-05-28 ENCOUNTER — Ambulatory Visit (HOSPITAL_COMMUNITY): Payer: Medicare HMO

## 2019-06-12 ENCOUNTER — Other Ambulatory Visit: Payer: Self-pay | Admitting: Internal Medicine

## 2019-06-12 DIAGNOSIS — M7989 Other specified soft tissue disorders: Secondary | ICD-10-CM

## 2019-06-15 ENCOUNTER — Other Ambulatory Visit: Payer: Self-pay

## 2019-06-15 ENCOUNTER — Ambulatory Visit (HOSPITAL_COMMUNITY)
Admission: RE | Admit: 2019-06-15 | Discharge: 2019-06-15 | Disposition: A | Discharge status: Home / Self Care | Payer: Medicare Other | Source: Ambulatory Visit | Attending: Internal Medicine | Admitting: Internal Medicine

## 2019-06-15 DIAGNOSIS — M7989 Other specified soft tissue disorders: Secondary | ICD-10-CM | POA: Insufficient documentation

## 2020-02-08 DIAGNOSIS — G8929 Other chronic pain: Secondary | ICD-10-CM | POA: Diagnosis not present

## 2020-05-02 DIAGNOSIS — G8929 Other chronic pain: Secondary | ICD-10-CM | POA: Diagnosis not present

## 2020-05-20 DIAGNOSIS — N521 Erectile dysfunction due to diseases classified elsewhere: Secondary | ICD-10-CM | POA: Diagnosis not present

## 2020-05-20 DIAGNOSIS — I10 Essential (primary) hypertension: Secondary | ICD-10-CM | POA: Diagnosis not present

## 2020-05-20 DIAGNOSIS — G4700 Insomnia, unspecified: Secondary | ICD-10-CM | POA: Diagnosis not present

## 2020-05-20 DIAGNOSIS — F329 Major depressive disorder, single episode, unspecified: Secondary | ICD-10-CM | POA: Diagnosis not present

## 2020-05-20 DIAGNOSIS — Z23 Encounter for immunization: Secondary | ICD-10-CM | POA: Diagnosis not present

## 2020-05-20 DIAGNOSIS — I82409 Acute embolism and thrombosis of unspecified deep veins of unspecified lower extremity: Secondary | ICD-10-CM | POA: Diagnosis not present

## 2020-06-29 DIAGNOSIS — I82409 Acute embolism and thrombosis of unspecified deep veins of unspecified lower extremity: Secondary | ICD-10-CM | POA: Diagnosis not present

## 2020-06-29 DIAGNOSIS — F329 Major depressive disorder, single episode, unspecified: Secondary | ICD-10-CM | POA: Diagnosis not present

## 2020-06-29 DIAGNOSIS — N521 Erectile dysfunction due to diseases classified elsewhere: Secondary | ICD-10-CM | POA: Diagnosis not present

## 2020-06-29 DIAGNOSIS — I10 Essential (primary) hypertension: Secondary | ICD-10-CM | POA: Diagnosis not present

## 2020-06-29 DIAGNOSIS — G4700 Insomnia, unspecified: Secondary | ICD-10-CM | POA: Diagnosis not present

## 2020-07-26 DIAGNOSIS — G8929 Other chronic pain: Secondary | ICD-10-CM | POA: Diagnosis not present

## 2020-08-18 DIAGNOSIS — Z7901 Long term (current) use of anticoagulants: Secondary | ICD-10-CM | POA: Diagnosis not present

## 2020-08-18 DIAGNOSIS — M160 Bilateral primary osteoarthritis of hip: Secondary | ICD-10-CM | POA: Diagnosis not present

## 2020-08-18 DIAGNOSIS — N189 Chronic kidney disease, unspecified: Secondary | ICD-10-CM | POA: Diagnosis not present

## 2020-08-18 DIAGNOSIS — I959 Hypotension, unspecified: Secondary | ICD-10-CM | POA: Diagnosis not present

## 2020-08-18 DIAGNOSIS — Z79891 Long term (current) use of opiate analgesic: Secondary | ICD-10-CM | POA: Diagnosis not present

## 2020-08-18 DIAGNOSIS — G8929 Other chronic pain: Secondary | ICD-10-CM | POA: Diagnosis not present

## 2020-08-18 DIAGNOSIS — R9082 White matter disease, unspecified: Secondary | ICD-10-CM | POA: Diagnosis not present

## 2020-08-18 DIAGNOSIS — R531 Weakness: Secondary | ICD-10-CM | POA: Diagnosis not present

## 2020-08-18 DIAGNOSIS — M5137 Other intervertebral disc degeneration, lumbosacral region: Secondary | ICD-10-CM | POA: Diagnosis not present

## 2020-08-18 DIAGNOSIS — F17210 Nicotine dependence, cigarettes, uncomplicated: Secondary | ICD-10-CM | POA: Diagnosis not present

## 2020-08-18 DIAGNOSIS — Z20822 Contact with and (suspected) exposure to covid-19: Secondary | ICD-10-CM | POA: Diagnosis not present

## 2020-08-18 DIAGNOSIS — I129 Hypertensive chronic kidney disease with stage 1 through stage 4 chronic kidney disease, or unspecified chronic kidney disease: Secondary | ICD-10-CM | POA: Diagnosis not present

## 2020-08-18 DIAGNOSIS — I700 Atherosclerosis of aorta: Secondary | ICD-10-CM | POA: Diagnosis not present

## 2020-08-18 DIAGNOSIS — Z86718 Personal history of other venous thrombosis and embolism: Secondary | ICD-10-CM | POA: Diagnosis not present

## 2020-08-18 DIAGNOSIS — M549 Dorsalgia, unspecified: Secondary | ICD-10-CM | POA: Diagnosis not present

## 2020-08-18 DIAGNOSIS — N179 Acute kidney failure, unspecified: Secondary | ICD-10-CM | POA: Diagnosis not present

## 2020-08-18 DIAGNOSIS — R55 Syncope and collapse: Secondary | ICD-10-CM | POA: Diagnosis not present

## 2020-08-18 DIAGNOSIS — I10 Essential (primary) hypertension: Secondary | ICD-10-CM | POA: Diagnosis not present

## 2020-08-18 DIAGNOSIS — Z888 Allergy status to other drugs, medicaments and biological substances status: Secondary | ICD-10-CM | POA: Diagnosis not present

## 2020-08-18 DIAGNOSIS — E860 Dehydration: Secondary | ICD-10-CM | POA: Diagnosis not present

## 2020-08-18 DIAGNOSIS — N281 Cyst of kidney, acquired: Secondary | ICD-10-CM | POA: Diagnosis not present

## 2020-08-18 DIAGNOSIS — Z720 Tobacco use: Secondary | ICD-10-CM | POA: Diagnosis not present

## 2020-08-18 DIAGNOSIS — M542 Cervicalgia: Secondary | ICD-10-CM | POA: Diagnosis not present

## 2020-08-18 DIAGNOSIS — J321 Chronic frontal sinusitis: Secondary | ICD-10-CM | POA: Diagnosis not present

## 2020-08-19 DIAGNOSIS — I959 Hypotension, unspecified: Secondary | ICD-10-CM | POA: Diagnosis not present

## 2020-08-19 DIAGNOSIS — N189 Chronic kidney disease, unspecified: Secondary | ICD-10-CM | POA: Diagnosis not present

## 2020-08-19 DIAGNOSIS — M549 Dorsalgia, unspecified: Secondary | ICD-10-CM | POA: Diagnosis not present

## 2020-08-19 DIAGNOSIS — G8929 Other chronic pain: Secondary | ICD-10-CM | POA: Diagnosis not present

## 2020-08-19 DIAGNOSIS — I129 Hypertensive chronic kidney disease with stage 1 through stage 4 chronic kidney disease, or unspecified chronic kidney disease: Secondary | ICD-10-CM | POA: Diagnosis not present

## 2020-08-19 DIAGNOSIS — M542 Cervicalgia: Secondary | ICD-10-CM | POA: Diagnosis not present

## 2020-08-19 DIAGNOSIS — R55 Syncope and collapse: Secondary | ICD-10-CM | POA: Diagnosis not present

## 2020-08-19 DIAGNOSIS — Z86718 Personal history of other venous thrombosis and embolism: Secondary | ICD-10-CM | POA: Diagnosis not present

## 2020-08-19 DIAGNOSIS — N179 Acute kidney failure, unspecified: Secondary | ICD-10-CM | POA: Diagnosis not present

## 2020-08-20 DIAGNOSIS — Z720 Tobacco use: Secondary | ICD-10-CM | POA: Diagnosis not present

## 2020-08-20 DIAGNOSIS — Z86718 Personal history of other venous thrombosis and embolism: Secondary | ICD-10-CM | POA: Diagnosis not present

## 2020-08-20 DIAGNOSIS — N179 Acute kidney failure, unspecified: Secondary | ICD-10-CM | POA: Diagnosis not present

## 2020-08-20 DIAGNOSIS — I129 Hypertensive chronic kidney disease with stage 1 through stage 4 chronic kidney disease, or unspecified chronic kidney disease: Secondary | ICD-10-CM | POA: Diagnosis not present

## 2020-08-20 DIAGNOSIS — N189 Chronic kidney disease, unspecified: Secondary | ICD-10-CM | POA: Diagnosis not present

## 2020-08-20 DIAGNOSIS — R55 Syncope and collapse: Secondary | ICD-10-CM | POA: Diagnosis not present

## 2020-10-11 DIAGNOSIS — Z131 Encounter for screening for diabetes mellitus: Secondary | ICD-10-CM | POA: Diagnosis not present

## 2020-10-11 DIAGNOSIS — E559 Vitamin D deficiency, unspecified: Secondary | ICD-10-CM | POA: Diagnosis not present

## 2020-10-11 DIAGNOSIS — Z6826 Body mass index (BMI) 26.0-26.9, adult: Secondary | ICD-10-CM | POA: Diagnosis not present

## 2020-10-11 DIAGNOSIS — Z125 Encounter for screening for malignant neoplasm of prostate: Secondary | ICD-10-CM | POA: Diagnosis not present

## 2020-10-11 DIAGNOSIS — I120 Hypertensive chronic kidney disease with stage 5 chronic kidney disease or end stage renal disease: Secondary | ICD-10-CM | POA: Diagnosis not present

## 2020-10-11 DIAGNOSIS — I82503 Chronic embolism and thrombosis of unspecified deep veins of lower extremity, bilateral: Secondary | ICD-10-CM | POA: Diagnosis not present

## 2020-10-11 DIAGNOSIS — I10 Essential (primary) hypertension: Secondary | ICD-10-CM | POA: Diagnosis not present

## 2020-10-17 DIAGNOSIS — G8929 Other chronic pain: Secondary | ICD-10-CM | POA: Diagnosis not present

## 2020-11-21 ENCOUNTER — Emergency Department (HOSPITAL_COMMUNITY): Payer: Medicare HMO

## 2020-11-21 ENCOUNTER — Emergency Department (HOSPITAL_COMMUNITY)
Admission: EM | Admit: 2020-11-21 | Discharge: 2020-11-21 | Disposition: A | Discharge status: Home / Self Care | Payer: Medicare HMO | Attending: Emergency Medicine | Admitting: Emergency Medicine

## 2020-11-21 ENCOUNTER — Other Ambulatory Visit: Payer: Self-pay

## 2020-11-21 ENCOUNTER — Encounter (HOSPITAL_COMMUNITY): Payer: Self-pay | Admitting: Emergency Medicine

## 2020-11-21 DIAGNOSIS — R401 Stupor: Secondary | ICD-10-CM | POA: Diagnosis not present

## 2020-11-21 DIAGNOSIS — Z87891 Personal history of nicotine dependence: Secondary | ICD-10-CM | POA: Insufficient documentation

## 2020-11-21 DIAGNOSIS — R0602 Shortness of breath: Secondary | ICD-10-CM | POA: Diagnosis not present

## 2020-11-21 DIAGNOSIS — Z20822 Contact with and (suspected) exposure to covid-19: Secondary | ICD-10-CM | POA: Insufficient documentation

## 2020-11-21 DIAGNOSIS — R4182 Altered mental status, unspecified: Secondary | ICD-10-CM | POA: Diagnosis not present

## 2020-11-21 DIAGNOSIS — R4020 Unspecified coma: Secondary | ICD-10-CM | POA: Diagnosis not present

## 2020-11-21 DIAGNOSIS — R404 Transient alteration of awareness: Secondary | ICD-10-CM | POA: Diagnosis not present

## 2020-11-21 DIAGNOSIS — R791 Abnormal coagulation profile: Secondary | ICD-10-CM | POA: Diagnosis not present

## 2020-11-21 DIAGNOSIS — R9431 Abnormal electrocardiogram [ECG] [EKG]: Secondary | ICD-10-CM | POA: Diagnosis not present

## 2020-11-21 DIAGNOSIS — Y900 Blood alcohol level of less than 20 mg/100 ml: Secondary | ICD-10-CM | POA: Diagnosis not present

## 2020-11-21 DIAGNOSIS — Z743 Need for continuous supervision: Secondary | ICD-10-CM | POA: Diagnosis not present

## 2020-11-21 DIAGNOSIS — R6889 Other general symptoms and signs: Secondary | ICD-10-CM | POA: Diagnosis not present

## 2020-11-21 DIAGNOSIS — I499 Cardiac arrhythmia, unspecified: Secondary | ICD-10-CM | POA: Diagnosis not present

## 2020-11-21 DIAGNOSIS — Z79899 Other long term (current) drug therapy: Secondary | ICD-10-CM | POA: Diagnosis not present

## 2020-11-21 LAB — CBC WITH DIFFERENTIAL/PLATELET
Abs Immature Granulocytes: 0.02 10*3/uL (ref 0.00–0.07)
Basophils Absolute: 0.1 10*3/uL (ref 0.0–0.1)
Basophils Relative: 1 %
Eosinophils Absolute: 0.3 10*3/uL (ref 0.0–0.5)
Eosinophils Relative: 4 %
HCT: 36.4 % — ABNORMAL LOW (ref 39.0–52.0)
Hemoglobin: 11.6 g/dL — ABNORMAL LOW (ref 13.0–17.0)
Immature Granulocytes: 0 %
Lymphocytes Relative: 31 %
Lymphs Abs: 2.2 10*3/uL (ref 0.7–4.0)
MCH: 29.4 pg (ref 26.0–34.0)
MCHC: 31.9 g/dL (ref 30.0–36.0)
MCV: 92.4 fL (ref 80.0–100.0)
Monocytes Absolute: 1 10*3/uL (ref 0.1–1.0)
Monocytes Relative: 13 %
Neutro Abs: 3.6 10*3/uL (ref 1.7–7.7)
Neutrophils Relative %: 51 %
Platelets: 194 10*3/uL (ref 150–400)
RBC: 3.94 MIL/uL — ABNORMAL LOW (ref 4.22–5.81)
RDW: 13.2 % (ref 11.5–15.5)
WBC: 7.1 10*3/uL (ref 4.0–10.5)
nRBC: 0 % (ref 0.0–0.2)

## 2020-11-21 LAB — COMPREHENSIVE METABOLIC PANEL
ALT: 18 U/L (ref 0–44)
AST: 22 U/L (ref 15–41)
Albumin: 3.9 g/dL (ref 3.5–5.0)
Alkaline Phosphatase: 76 U/L (ref 38–126)
Anion gap: 3 — ABNORMAL LOW (ref 5–15)
BUN: 16 mg/dL (ref 8–23)
CO2: 31 mmol/L (ref 22–32)
Calcium: 8.8 mg/dL — ABNORMAL LOW (ref 8.9–10.3)
Chloride: 106 mmol/L (ref 98–111)
Creatinine, Ser: 1.02 mg/dL (ref 0.61–1.24)
GFR, Estimated: >60 mL/min (ref >60)
Glucose, Bld: 123 mg/dL — ABNORMAL HIGH (ref 70–99)
Potassium: 4.1 mmol/L (ref 3.5–5.1)
Sodium: 140 mmol/L (ref 135–145)
Total Bilirubin: 0.3 mg/dL (ref 0.3–1.2)
Total Protein: 6.8 g/dL (ref 6.5–8.1)

## 2020-11-21 LAB — TSH: TSH: 1.382 u[IU]/mL (ref 0.350–4.500)

## 2020-11-21 LAB — URINALYSIS, ROUTINE W REFLEX MICROSCOPIC
Bilirubin Urine: NEGATIVE
Glucose, UA: NEGATIVE mg/dL
Hgb urine dipstick: NEGATIVE
Ketones, ur: NEGATIVE mg/dL
Leukocytes,Ua: NEGATIVE
Nitrite: NEGATIVE
Protein, ur: NEGATIVE mg/dL
Specific Gravity, Urine: 1.025 (ref 1.005–1.030)
pH: 6 (ref 5.0–8.0)

## 2020-11-21 LAB — PROTIME-INR
INR: 1.3 — ABNORMAL HIGH (ref 0.8–1.2)
Prothrombin Time: 16.1 seconds — ABNORMAL HIGH (ref 11.4–15.2)

## 2020-11-21 LAB — RESP PANEL BY RT-PCR (FLU A&B, COVID) ARPGX2
Influenza A by PCR: NEGATIVE
Influenza B by PCR: NEGATIVE
SARS Coronavirus 2 by RT PCR: NEGATIVE

## 2020-11-21 LAB — BRAIN NATRIURETIC PEPTIDE: B Natriuretic Peptide: 30 pg/mL (ref 0.0–100.0)

## 2020-11-21 LAB — ACETAMINOPHEN LEVEL: Acetaminophen (Tylenol), Serum: <10 ug/mL — ABNORMAL LOW (ref 10–30)

## 2020-11-21 LAB — RAPID URINE DRUG SCREEN, HOSP PERFORMED
Amphetamines: NOT DETECTED
Barbiturates: NOT DETECTED
Benzodiazepines: POSITIVE — AB
Cocaine: NOT DETECTED
Opiates: POSITIVE — AB
Tetrahydrocannabinol: POSITIVE — AB

## 2020-11-21 LAB — TROPONIN I (HIGH SENSITIVITY)
Troponin I (High Sensitivity): 8 ng/L (ref <18)
Troponin I (High Sensitivity): 9 ng/L (ref <18)

## 2020-11-21 LAB — ETHANOL: Alcohol, Ethyl (B): <10 mg/dL (ref <10)

## 2020-11-21 LAB — AMMONIA: Ammonia: 17 umol/L (ref 9–35)

## 2020-11-21 NOTE — ED Notes (Addendum)
Additional IV removed and EDP made aware; pt refuses discharge v/s and transport off of unit by w/c; discharge paperwork given and medication returned; this RN escorted pt to front door; pt ambulated with steady gait but cussing on the way out stating staff are "dumb asses"

## 2020-11-21 NOTE — ED Provider Notes (Signed)
Emergency Department Provider Note   I have reviewed the triage vital signs and the nursing notes.   HISTORY  Chief Complaint Altered Mental Status   HPI Jimmy Osborne is a 65 y.o. male with past medical history reviewed below including chronic opiate abuse, AKI, CAP, EtOH use, presents to the emergency department with altered mental status.  According to EMS the patient has been drowsy and somewhat confused today which is different from his normal.  In speaking with the patient's fianc, Darl Pikes, by phone she tells me that he has been more sleepy over the past at least 3 days.  She is not noticed any fevers.  She states he seemed confused at times but that symptoms are intermittent.  She has not noticed a clear association with any of his home medications which do include chronic opiate.  He tells me that he was admitted in June 2 [facility] for kidney injury but ultimately did not require dialysis.  She followed with the patient's primary doctor several days later and lab work had normalized.  She has some concern that he may be developing dementia and admits that several of these symptoms have been ongoing for months to almost a year.   The patient arrives somnolent and unable to provide a full history but is able to tell me his name, that he is at [facility], that is 2022. He denies any pain, HA, SOB. Level 5 caveat: AMS.   Past Medical History:  Diagnosis Date   Ankylosing spondylitis (HCC)    Renal disorder    kidney stones    Patient Active Problem List   Diagnosis Date Noted   Rectal bleeding 11/08/2017   CAP (community acquired pneumonia) 04/02/2015   OSA (obstructive sleep apnea) 04/02/2015   Alcohol abuse 04/02/2015   Depression 04/02/2015    Past Surgical History:  Procedure Laterality Date   hernia x 2     Inguinal hernia and umblical hernia   lithotrypsy      Allergies Fentanyl  No family history on file.  Social History Social History    Tobacco Use   Smoking status: Former    Types: Cigarettes   Smokeless tobacco: Never  Substance Use Topics   Alcohol use: No   Drug use: No    Review of Systems  Constitutional: No fever/chills Eyes: No visual changes. ENT: No sore throat. Cardiovascular: Denies chest pain. Respiratory: Denies shortness of breath. Gastrointestinal: No abdominal pain.  No nausea, no vomiting.  No diarrhea.  No constipation. Genitourinary: Negative for dysuria. Musculoskeletal: Negative for back pain. Skin: Negative for rash. Neurological: Negative for headaches, focal weakness or numbness.  10-point ROS otherwise negative.  ____________________________________________   PHYSICAL EXAM:  VITAL SIGNS: ED Triage Vitals  Enc Vitals Group     BP 11/21/20 2031 131/65     Pulse Rate 11/21/20 2031 (!) 57     Resp 11/21/20 2031 16     Temp 11/21/20 2031 (!) 97.5 F (36.4 C)     Temp Source 11/21/20 2031 Oral     SpO2 11/21/20 2031 97 %     Weight 11/21/20 2030 198 lb (89.8 kg)     Height 11/21/20 2030 6' (1.829 m)   Constitutional: Somnolent but opens eyes to voice. No acute distress.  Eyes: Conjunctivae are normal. PERRL (3 mm) and sluggish bilaterally.  Head: Atraumatic. Nose: No congestion/rhinnorhea. Mouth/Throat: Mucous membranes are slightly dry.  Neck: No stridor.   Cardiovascular: Bradycardia. Good peripheral circulation. Grossly  normal heart sounds.   Respiratory: Normal respiratory effort.  No retractions. Lungs CTAB. Gastrointestinal: Soft and nontender. Mild distention.  Musculoskeletal: No lower extremity tenderness with 2+ pitting edema in the bilateral LEs. No gross deformities of extremities. Neurologic:  Somnolent but awakens to voice. No clear dysarthria. No gross focal neurologic deficits are appreciated.  Skin:  Skin is warm, dry and intact. No rash noted.   ____________________________________________   LABS (all labs ordered are listed, but only abnormal  results are displayed)  Labs Reviewed  COMPREHENSIVE METABOLIC PANEL - Abnormal; Notable for the following components:      Result Value   Glucose, Bld 123 (*)    Calcium 8.8 (*)    Anion gap 3 (*)    All other components within normal limits  ACETAMINOPHEN LEVEL - Abnormal; Notable for the following components:   Acetaminophen (Tylenol), Serum <10 (*)    All other components within normal limits  CBC WITH DIFFERENTIAL/PLATELET - Abnormal; Notable for the following components:   RBC 3.94 (*)    Hemoglobin 11.6 (*)    HCT 36.4 (*)    All other components within normal limits  PROTIME-INR - Abnormal; Notable for the following components:   Prothrombin Time 16.1 (*)    INR 1.3 (*)    All other components within normal limits  RESP PANEL BY RT-PCR (FLU A&B, COVID) ARPGX2  ETHANOL  BRAIN NATRIURETIC PEPTIDE  AMMONIA  TSH  URINALYSIS, ROUTINE W REFLEX MICROSCOPIC  RAPID URINE DRUG SCREEN, HOSP PERFORMED  TROPONIN I (HIGH SENSITIVITY)  TROPONIN I (HIGH SENSITIVITY)   ____________________________________________  EKG   EKG Interpretation  Date/Time:  Monday November 21 2020 20:34:35 EDT Ventricular Rate:  61 PR Interval:  162 QRS Duration: 101 QT Interval:  451 QTC Calculation: 455 R Axis:   12 Text Interpretation: Sinus rhythm Abnormal R-wave progression, early transition Confirmed by Alona Bene [phone]) on 11/21/2020 10:28:58 PM        ____________________________________________  RADIOLOGY  CT HEAD WO CONTRAST ( )  Result Date: 11/21/2020 CLINICAL DATA:  Mental status change, unknown cause. EXAM: CT HEAD WITHOUT CONTRAST TECHNIQUE: Contiguous axial images were obtained from the base of the skull through the vertex without intravenous contrast. COMPARISON:  08/18/2020 FINDINGS: Brain: Normal anatomic configuration. Parenchymal volume loss is commensurate with the patient's age. Mild periventricular white matter changes are present likely reflecting the sequela of  small vessel ischemia. No abnormal intra or extra-axial mass lesion or fluid collection. No abnormal mass effect or midline shift. No evidence of acute intracranial hemorrhage or infarct. Ventricular size is normal. Cerebellum unremarkable. Vascular: No asymmetric hyperdense vasculature at the skull base. Skull: Intact Sinuses/Orbits: Paranasal sinuses are clear. Orbits are unremarkable. Other: Mastoid air cells and middle ear cavities are clear. IMPRESSION: No acute intracranial abnormality.  Mild senescent change. Electronically Signed   By: Helyn Numbers M.D.   On: 11/21/2020 21:07   DG Chest Portable 1 View  Result Date: 11/21/2020 CLINICAL DATA:  Shortness of breath EXAM: PORTABLE CHEST 1 VIEW COMPARISON:  August 18, 2020. FINDINGS: The heart size and mediastinal contours are within normal limits. Low lung volumes. Linear opacity in left lung base is favored atelectasis. No visible pleural effusion or pneumothorax. The visualized skeletal structures are unremarkable. IMPRESSION: Low lung volumes with linear opacity in the left lung base, favored atelectasis. Otherwise evidence of active cardiopulmonary disease. Electronically Signed   By: Maudry Mayhew M.D.   On: 11/21/2020 20:46    ____________________________________________   PROCEDURES  Procedure(s) performed:   Procedures  None  ____________________________________________   INITIAL IMPRESSION / ASSESSMENT AND PLAN / ED COURSE  Pertinent labs & imaging results that were available during my care of the patient were reviewed by me and considered in my medical decision making (see chart for details).   Patient presents emergency department with altered mental status over the past 3 days.  In speaking with the patient's fianc seems more stuttering, on/off symptoms.  Patient does take chronic opiate medications.  Labs obtained looking for cause of possible metabolic encephalopathy.  Normal TSH.  Normal BNP, COVID, ammonia.  Ethanol is  within normal limits.  No acute kidney injury. CT head reviewed with no bleeding or obvious CVA.   10:20 PM  On reassessment the patient is more awake and alert.  His eyes are open and he flagged me down to come in his room.  He needs to use the bathroom and I assisted him with a urinal.  He is neuro intact. UA and U tox pending.   10:48 PM  Called to the bedside by nursing staff.  Patient is holding small bottle with blue and green tablets, some are cut in half.  He was observed holding several tablets in his hand.  In reviewing further these are oxycodone 30 and oxycodone 15.  He denies taking these pills in the emergency department and tells me just counting about and that he had not had any today. These were temporarily taken from the patient. He denies any self harm intention to me. UA and U tox pending but patient is now up and ambulatory in the ED. Will ultimately discharge but suspect his symptoms are medication related.   Patient became upset and is leaving the ED. He is ambulatory with a steady gait. Calling for a ride.  ____________________________________________  FINAL CLINICAL IMPRESSION(S) / ED DIAGNOSES  Final diagnoses:  Stupor  Polypharmacy     Note:  This document was prepared using Dragon voice recognition software and may include unintentional dictation errors.  Alona Bene, MD, [facility]    Armine Rizzolo, Arlyss Repress, MD 11/21/20 [phone]

## 2020-11-21 NOTE — ED Notes (Signed)
Pill identifier identifies pills as oxycodone 30mg  (blue) and oxycodone 15mg  (green); pt is noted to be standing at the sink in the room and has removed his IV with blood on the floor and sink; this RN entered room and instructed pt to sit on the bed; dressing applied and secured; blood cleaned from floor and sink; pt angry stating "y'all don't know shit. I'm leaving", this RN attempted to calm pt and asked if medication is prescribed to him, pt states it is, when asked how he has been instructed to take meds, pt reports he takes 3 oxy 30s 3 times a day and takes oxy 15s 6 times a day; this RN repeated dosage to pt to clarify, pt then states he takes 1 oxy 30 every 8 hours and oxy 15s for breakthrough pain

## 2020-11-21 NOTE — Discharge Instructions (Addendum)
Please take your medications as prescribed.  I suspect that this is contributing to your sleepiness and occasional confusion.

## 2020-11-21 NOTE — ED Notes (Addendum)
Consulting civil engineer and this RN entered pt room, pt lying in bed pouring green whole tablets and half blue tablets from a generic pill bottle into his hand, when asked pt what he's doing he states "taking my pills"; this RN asked pt what medication he is taking, pt reports "oxy 30's and oxy 15's", pills removed from pt and pt is advised he cannot take additional medication at this time; medication removed from room; EDP made aware

## 2020-11-21 NOTE — ED Triage Notes (Signed)
Pt here by [facility], called out for breathing difficulty and AMS intermittently x 1 mo; [id number]; pt slow to respond

## 2020-11-21 NOTE — ED Notes (Signed)
Lab staff reports to charge RN that pt had pill bottle in his hand upon her entering pt room

## 2021-01-19 DIAGNOSIS — I82503 Chronic embolism and thrombosis of unspecified deep veins of lower extremity, bilateral: Secondary | ICD-10-CM | POA: Diagnosis not present

## 2021-01-19 DIAGNOSIS — Z6828 Body mass index (BMI) 28.0-28.9, adult: Secondary | ICD-10-CM | POA: Diagnosis not present

## 2021-01-19 DIAGNOSIS — I10 Essential (primary) hypertension: Secondary | ICD-10-CM | POA: Diagnosis not present

## 2021-01-19 DIAGNOSIS — I120 Hypertensive chronic kidney disease with stage 5 chronic kidney disease or end stage renal disease: Secondary | ICD-10-CM | POA: Diagnosis not present

## 2021-01-19 DIAGNOSIS — Z0000 Encounter for general adult medical examination without abnormal findings: Secondary | ICD-10-CM | POA: Diagnosis not present

## 2021-01-19 DIAGNOSIS — F330 Major depressive disorder, recurrent, mild: Secondary | ICD-10-CM | POA: Diagnosis not present

## 2021-02-06 DIAGNOSIS — G8929 Other chronic pain: Secondary | ICD-10-CM | POA: Diagnosis not present

## 2021-04-04 DIAGNOSIS — G8929 Other chronic pain: Secondary | ICD-10-CM | POA: Diagnosis not present

## 2021-05-29 DIAGNOSIS — I10 Essential (primary) hypertension: Secondary | ICD-10-CM | POA: Diagnosis not present

## 2021-05-29 DIAGNOSIS — I82503 Chronic embolism and thrombosis of unspecified deep veins of lower extremity, bilateral: Secondary | ICD-10-CM | POA: Diagnosis not present

## 2021-05-29 DIAGNOSIS — Z0000 Encounter for general adult medical examination without abnormal findings: Secondary | ICD-10-CM | POA: Diagnosis not present

## 2021-05-29 DIAGNOSIS — I120 Hypertensive chronic kidney disease with stage 5 chronic kidney disease or end stage renal disease: Secondary | ICD-10-CM | POA: Diagnosis not present

## 2021-05-29 DIAGNOSIS — R4189 Other symptoms and signs involving cognitive functions and awareness: Secondary | ICD-10-CM | POA: Diagnosis not present

## 2021-05-29 DIAGNOSIS — F330 Major depressive disorder, recurrent, mild: Secondary | ICD-10-CM | POA: Diagnosis not present

## 2021-05-29 DIAGNOSIS — Z6827 Body mass index (BMI) 27.0-27.9, adult: Secondary | ICD-10-CM | POA: Diagnosis not present

## 2021-05-30 DIAGNOSIS — I120 Hypertensive chronic kidney disease with stage 5 chronic kidney disease or end stage renal disease: Secondary | ICD-10-CM | POA: Diagnosis not present

## 2021-05-30 DIAGNOSIS — I10 Essential (primary) hypertension: Secondary | ICD-10-CM | POA: Diagnosis not present

## 2021-05-30 DIAGNOSIS — I82503 Chronic embolism and thrombosis of unspecified deep veins of lower extremity, bilateral: Secondary | ICD-10-CM | POA: Diagnosis not present

## 2021-05-30 DIAGNOSIS — R4189 Other symptoms and signs involving cognitive functions and awareness: Secondary | ICD-10-CM | POA: Diagnosis not present

## 2021-05-30 DIAGNOSIS — F330 Major depressive disorder, recurrent, mild: Secondary | ICD-10-CM | POA: Diagnosis not present

## 2021-05-30 DIAGNOSIS — Z0000 Encounter for general adult medical examination without abnormal findings: Secondary | ICD-10-CM | POA: Diagnosis not present

## 2021-07-05 DIAGNOSIS — I6782 Cerebral ischemia: Secondary | ICD-10-CM | POA: Diagnosis not present

## 2021-07-05 DIAGNOSIS — R4701 Aphasia: Secondary | ICD-10-CM | POA: Diagnosis not present

## 2021-07-05 DIAGNOSIS — I6789 Other cerebrovascular disease: Secondary | ICD-10-CM | POA: Diagnosis not present

## 2021-10-16 DIAGNOSIS — G8929 Other chronic pain: Secondary | ICD-10-CM | POA: Diagnosis not present

## 2021-10-19 DIAGNOSIS — Z0000 Encounter for general adult medical examination without abnormal findings: Secondary | ICD-10-CM | POA: Diagnosis not present

## 2021-10-19 DIAGNOSIS — Z6827 Body mass index (BMI) 27.0-27.9, adult: Secondary | ICD-10-CM | POA: Diagnosis not present

## 2021-10-19 DIAGNOSIS — I82503 Chronic embolism and thrombosis of unspecified deep veins of lower extremity, bilateral: Secondary | ICD-10-CM | POA: Diagnosis not present

## 2021-10-19 DIAGNOSIS — I120 Hypertensive chronic kidney disease with stage 5 chronic kidney disease or end stage renal disease: Secondary | ICD-10-CM | POA: Diagnosis not present

## 2021-10-19 DIAGNOSIS — F330 Major depressive disorder, recurrent, mild: Secondary | ICD-10-CM | POA: Diagnosis not present

## 2021-10-19 DIAGNOSIS — I10 Essential (primary) hypertension: Secondary | ICD-10-CM | POA: Diagnosis not present

## 2021-10-19 DIAGNOSIS — M5459 Other low back pain: Secondary | ICD-10-CM | POA: Diagnosis not present

## 2021-10-19 DIAGNOSIS — R4189 Other symptoms and signs involving cognitive functions and awareness: Secondary | ICD-10-CM | POA: Diagnosis not present

## 2021-11-01 DIAGNOSIS — M545 Low back pain, unspecified: Secondary | ICD-10-CM | POA: Diagnosis not present

## 2021-11-01 DIAGNOSIS — M546 Pain in thoracic spine: Secondary | ICD-10-CM | POA: Diagnosis not present

## 2021-11-01 DIAGNOSIS — R079 Chest pain, unspecified: Secondary | ICD-10-CM | POA: Diagnosis not present

## 2021-11-01 DIAGNOSIS — R0602 Shortness of breath: Secondary | ICD-10-CM | POA: Diagnosis not present

## 2021-11-01 DIAGNOSIS — M40204 Unspecified kyphosis, thoracic region: Secondary | ICD-10-CM | POA: Diagnosis not present

## 2021-12-03 IMAGING — CT CT HEAD W/O CM
3 series · 16 of 47 positions shown, 19 images · non-contrast
Comparison: 08/18/2020

CLINICAL DATA: Mental status change, unknown cause.

EXAM:
CT HEAD WITHOUT CONTRAST
TECHNIQUE: Contiguous axial images were obtained from the base of the skull
through the vertex without intravenous contrast.

[Series 2: head w o · axial · 0.42mm/px · z∈[+26,+171]mm · 10 of 35 slices shown, 13 images]
[im 3/35  brain]
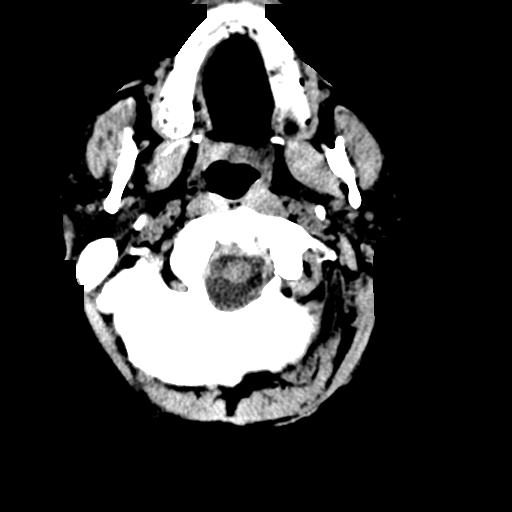
[im 3/35  bone]
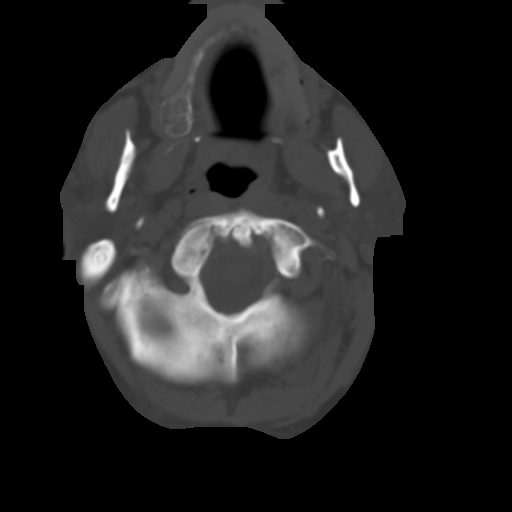
[im 6/35  brain]
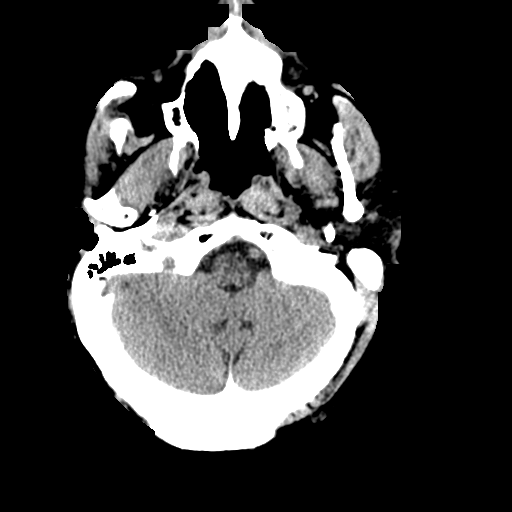
[im 10/35  brain]
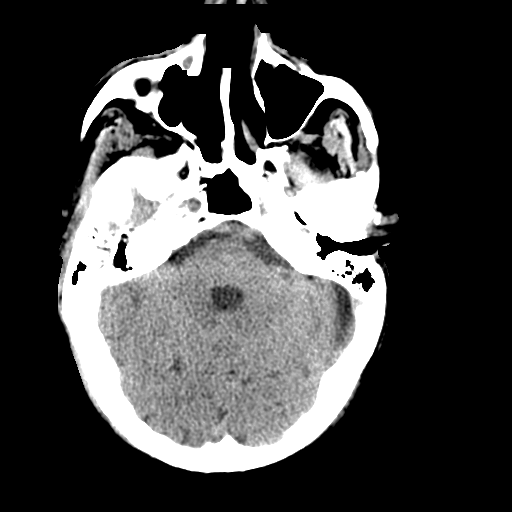
[im 12/35  brain]
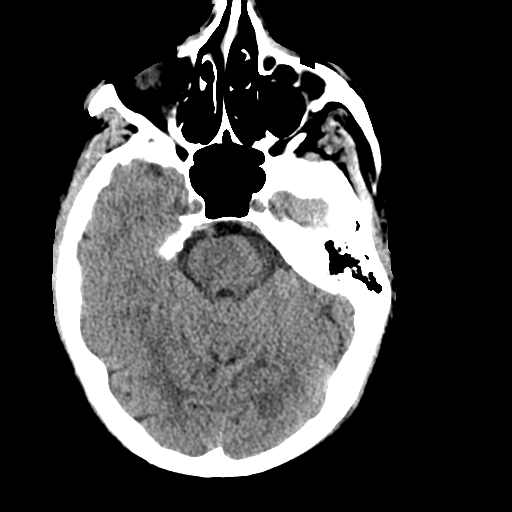
[im 16/35  brain]
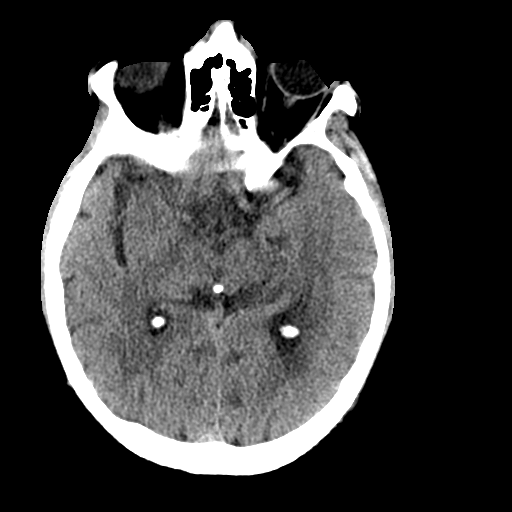
[im 16/35  bone]
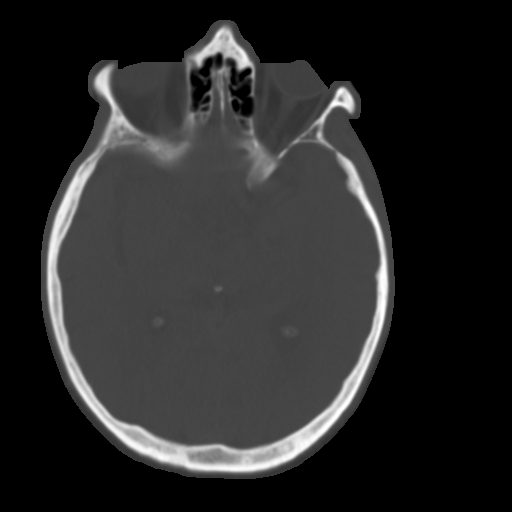
[im 19/35  brain]
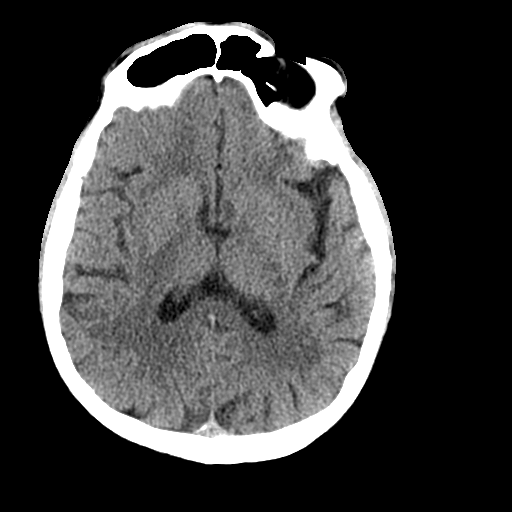
[im 23/35  brain]
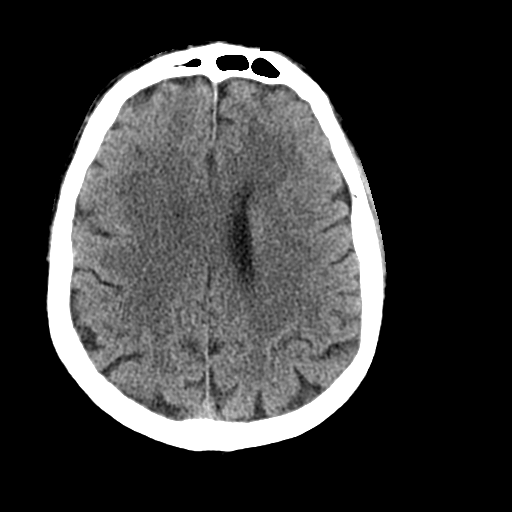
[im 26/35  brain]
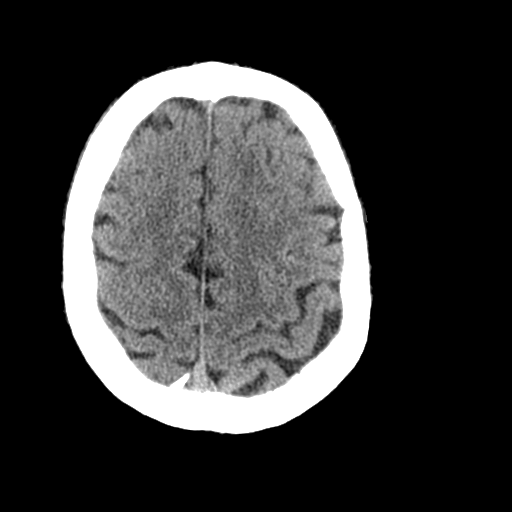
[im 29/35  brain]
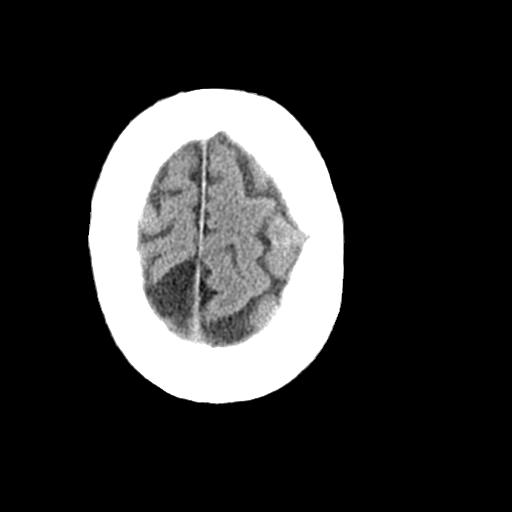
[im 29/35  bone]
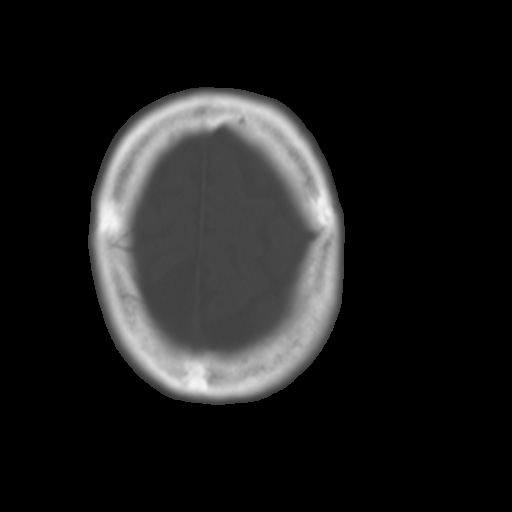
[im 32/35  brain]
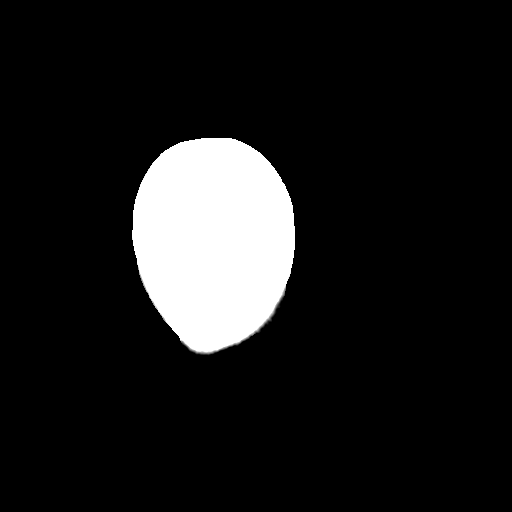

[Series 4: coronal soft · coronal · 0.36mm/px · 3 of 73 slices shown]
[im 25/73  brain]
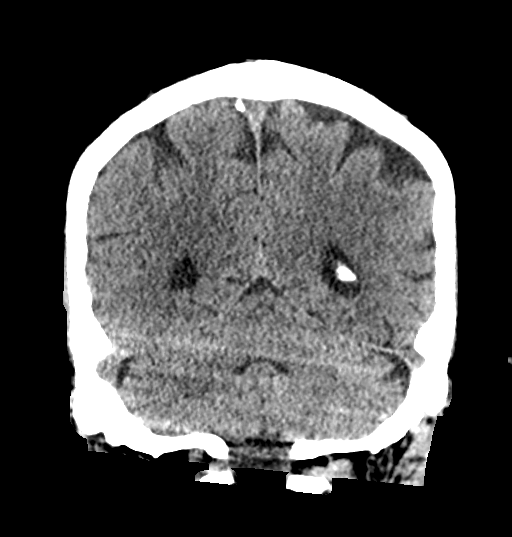
[im 33/73  brain]
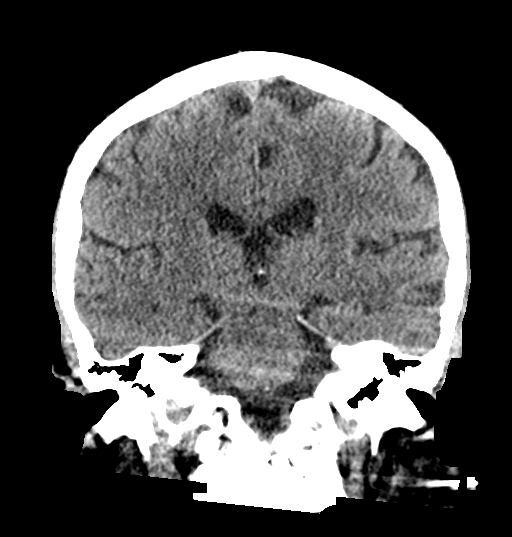
[im 41/73  brain]
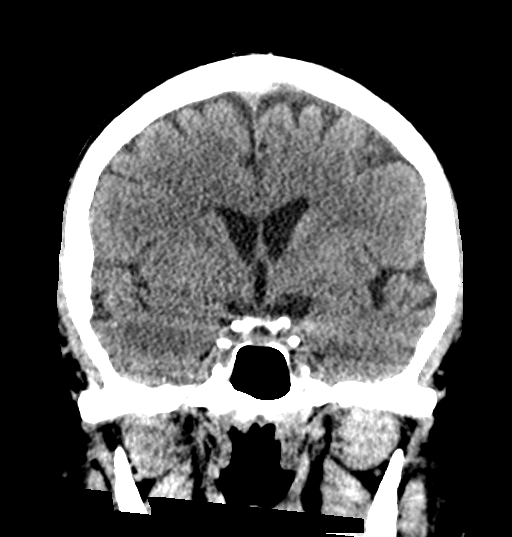

[Series 5: sagittal soft · sagittal · 0.36mm/px · 3 of 58 slices shown]
[im 23/58  brain]
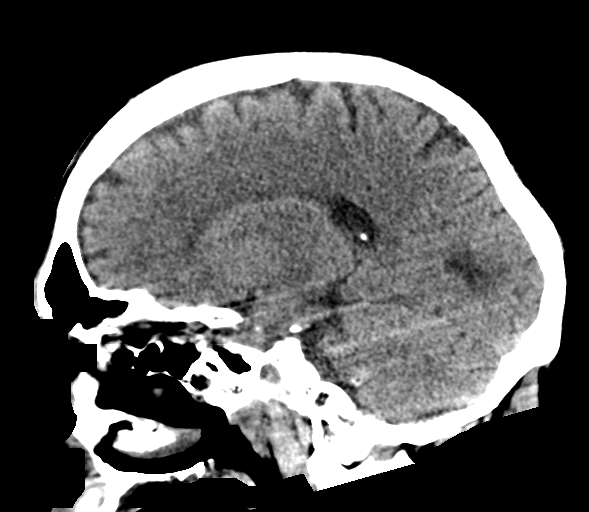
[im 29/58  brain]
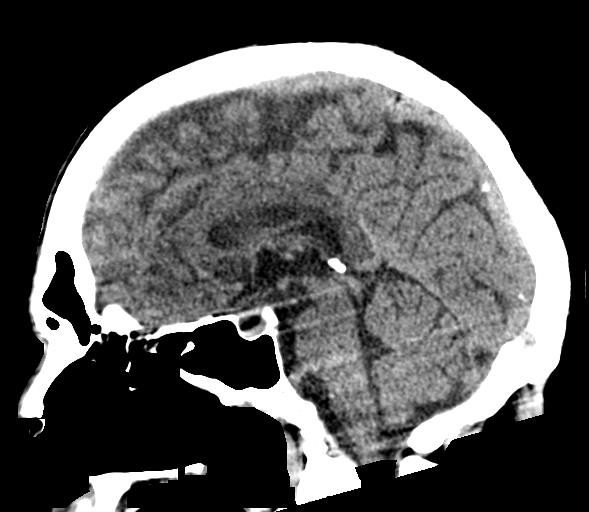
[im 36/58  brain]
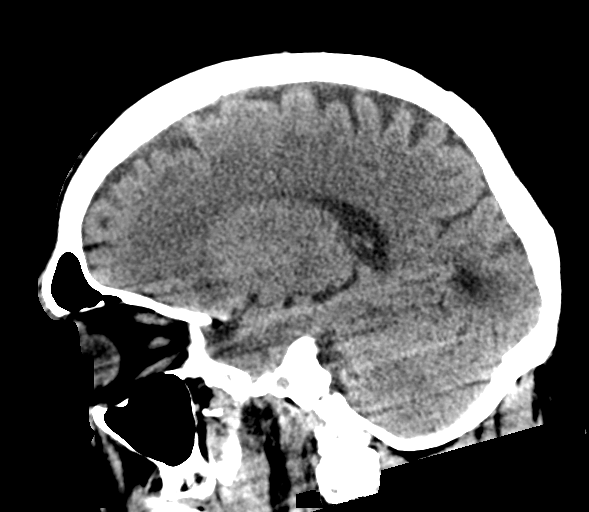

[16 of 47 positions shown; findings below may reference images not displayed]

FINDINGS: Brain: Normal anatomic configuration. Parenchymal volume loss is
commensurate with the patient's age. Mild periventricular white
matter changes are present likely reflecting the sequela of small
vessel ischemia. No abnormal intra or extra-axial mass lesion or
fluid collection. No abnormal mass effect or midline shift. No
evidence of acute intracranial hemorrhage or infarct. Ventricular
size is normal. Cerebellum unremarkable.

Vascular: No asymmetric hyperdense vasculature at the skull base.

Skull: Intact

Sinuses/Orbits: Paranasal sinuses are clear. Orbits are
unremarkable.

Other: Mastoid air cells and middle ear cavities are clear.
IMPRESSION: No acute intracranial abnormality.  Mild senescent change.

## 2022-02-05 DIAGNOSIS — G8929 Other chronic pain: Secondary | ICD-10-CM | POA: Diagnosis not present

## 2022-03-08 ENCOUNTER — Encounter (HOSPITAL_COMMUNITY): Payer: Self-pay | Admitting: Emergency Medicine

## 2022-03-08 ENCOUNTER — Other Ambulatory Visit: Payer: Self-pay

## 2022-03-08 ENCOUNTER — Emergency Department (HOSPITAL_COMMUNITY)
Admission: EM | Admit: 2022-03-08 | Discharge: 2022-03-09 | Disposition: A | Discharge status: Home / Self Care | Payer: Medicare PPO | Attending: Emergency Medicine | Admitting: Emergency Medicine

## 2022-03-08 DIAGNOSIS — E860 Dehydration: Secondary | ICD-10-CM

## 2022-03-08 DIAGNOSIS — N390 Urinary tract infection, site not specified: Secondary | ICD-10-CM | POA: Diagnosis not present

## 2022-03-08 DIAGNOSIS — R112 Nausea with vomiting, unspecified: Secondary | ICD-10-CM

## 2022-03-08 DIAGNOSIS — Z20822 Contact with and (suspected) exposure to covid-19: Secondary | ICD-10-CM | POA: Insufficient documentation

## 2022-03-08 DIAGNOSIS — R1084 Generalized abdominal pain: Secondary | ICD-10-CM | POA: Diagnosis not present

## 2022-03-08 DIAGNOSIS — E876 Hypokalemia: Secondary | ICD-10-CM | POA: Diagnosis not present

## 2022-03-08 DIAGNOSIS — N200 Calculus of kidney: Secondary | ICD-10-CM | POA: Diagnosis not present

## 2022-03-08 DIAGNOSIS — R109 Unspecified abdominal pain: Secondary | ICD-10-CM

## 2022-03-08 DIAGNOSIS — R509 Fever, unspecified: Secondary | ICD-10-CM | POA: Diagnosis not present

## 2022-03-08 DIAGNOSIS — K5790 Diverticulosis of intestine, part unspecified, without perforation or abscess without bleeding: Secondary | ICD-10-CM

## 2022-03-08 DIAGNOSIS — Z87891 Personal history of nicotine dependence: Secondary | ICD-10-CM | POA: Insufficient documentation

## 2022-03-08 DIAGNOSIS — K5730 Diverticulosis of large intestine without perforation or abscess without bleeding: Secondary | ICD-10-CM | POA: Insufficient documentation

## 2022-03-08 DIAGNOSIS — N281 Cyst of kidney, acquired: Secondary | ICD-10-CM | POA: Diagnosis not present

## 2022-03-08 LAB — RESP PANEL BY RT-PCR (RSV, FLU A&B, COVID)  RVPGX2
Influenza A by PCR: NEGATIVE
Influenza B by PCR: NEGATIVE
Resp Syncytial Virus by PCR: NEGATIVE
SARS Coronavirus 2 by RT PCR: NEGATIVE

## 2022-03-08 MED ORDER — ONDANSETRON 8 MG PO TBDP
ORAL_TABLET | ORAL | Status: AC
  Filled 2022-03-08: qty 1

## 2022-03-08 MED ORDER — ONDANSETRON 8 MG PO TBDP
8.0000 mg | ORAL_TABLET | Freq: Once | ORAL | Status: AC
  Administered 2022-03-08: 8 mg via ORAL

## 2022-03-08 NOTE — ED Triage Notes (Signed)
  Patient comes in with fever and emesis that has been going on for 4 days.  Patient states he had fever of 104 yesterday.  Has had several emesis episodes today.  Having body aches and lethargy.  Pain 7/10, headache.  Last took ibuprofen last night.

## 2022-03-09 ENCOUNTER — Encounter (HOSPITAL_COMMUNITY): Payer: Self-pay | Admitting: Radiology

## 2022-03-09 ENCOUNTER — Emergency Department (HOSPITAL_COMMUNITY): Payer: Medicare PPO

## 2022-03-09 DIAGNOSIS — N281 Cyst of kidney, acquired: Secondary | ICD-10-CM | POA: Diagnosis not present

## 2022-03-09 DIAGNOSIS — N200 Calculus of kidney: Secondary | ICD-10-CM | POA: Diagnosis not present

## 2022-03-09 DIAGNOSIS — R509 Fever, unspecified: Secondary | ICD-10-CM | POA: Diagnosis not present

## 2022-03-09 DIAGNOSIS — R109 Unspecified abdominal pain: Secondary | ICD-10-CM | POA: Diagnosis not present

## 2022-03-09 LAB — URINALYSIS, ROUTINE W REFLEX MICROSCOPIC
Bilirubin Urine: NEGATIVE
Glucose, UA: NEGATIVE mg/dL
Ketones, ur: 20 mg/dL — AB
Leukocytes,Ua: NEGATIVE
Nitrite: NEGATIVE
Protein, ur: 100 mg/dL — AB
RBC / HPF: >50 RBC/hpf — ABNORMAL HIGH (ref 0–5)
Specific Gravity, Urine: 1.02 (ref 1.005–1.030)
pH: 6 (ref 5.0–8.0)

## 2022-03-09 LAB — COMPREHENSIVE METABOLIC PANEL
ALT: 19 U/L (ref 0–44)
AST: 27 U/L (ref 15–41)
Albumin: 4.3 g/dL (ref 3.5–5.0)
Alkaline Phosphatase: 62 U/L (ref 38–126)
Anion gap: 9 (ref 5–15)
BUN: 19 mg/dL (ref 8–23)
CO2: 33 mmol/L — ABNORMAL HIGH (ref 22–32)
Calcium: 9.5 mg/dL (ref 8.9–10.3)
Chloride: 100 mmol/L (ref 98–111)
Creatinine, Ser: 1.41 mg/dL — ABNORMAL HIGH (ref 0.61–1.24)
GFR, Estimated: 55 mL/min — ABNORMAL LOW (ref >60)
Glucose, Bld: 126 mg/dL — ABNORMAL HIGH (ref 70–99)
Potassium: 2.9 mmol/L — ABNORMAL LOW (ref 3.5–5.1)
Sodium: 142 mmol/L (ref 135–145)
Total Bilirubin: 0.7 mg/dL (ref 0.3–1.2)
Total Protein: 7.3 g/dL (ref 6.5–8.1)

## 2022-03-09 LAB — CBC WITH DIFFERENTIAL/PLATELET
Abs Immature Granulocytes: 0.01 10*3/uL (ref 0.00–0.07)
Basophils Absolute: 0 10*3/uL (ref 0.0–0.1)
Basophils Relative: 1 %
Eosinophils Absolute: 0.1 10*3/uL (ref 0.0–0.5)
Eosinophils Relative: 1 %
HCT: 36.4 % — ABNORMAL LOW (ref 39.0–52.0)
Hemoglobin: 12.3 g/dL — ABNORMAL LOW (ref 13.0–17.0)
Immature Granulocytes: 0 %
Lymphocytes Relative: 32 %
Lymphs Abs: 2.8 10*3/uL (ref 0.7–4.0)
MCH: 28.9 pg (ref 26.0–34.0)
MCHC: 33.8 g/dL (ref 30.0–36.0)
MCV: 85.4 fL (ref 80.0–100.0)
Monocytes Absolute: 1 10*3/uL (ref 0.1–1.0)
Monocytes Relative: 11 %
Neutro Abs: 4.9 10*3/uL (ref 1.7–7.7)
Neutrophils Relative %: 55 %
Platelets: 237 10*3/uL (ref 150–400)
RBC: 4.26 MIL/uL (ref 4.22–5.81)
RDW: 13.6 % (ref 11.5–15.5)
WBC: 8.8 10*3/uL (ref 4.0–10.5)
nRBC: 0 % (ref 0.0–0.2)

## 2022-03-09 LAB — LIPASE, BLOOD: Lipase: 25 U/L (ref 11–51)

## 2022-03-09 MED ORDER — POTASSIUM CHLORIDE CRYS ER 20 MEQ PO TBCR
40.0000 meq | EXTENDED_RELEASE_TABLET | Freq: Once | ORAL | Status: AC
  Administered 2022-03-09: 40 meq via ORAL
  Filled 2022-03-09: qty 2

## 2022-03-09 MED ORDER — CEPHALEXIN 500 MG PO CAPS: 500.0000 mg | ORAL_CAPSULE | Freq: Three times a day (TID) | ORAL | 0 refills | Status: AC

## 2022-03-09 MED ORDER — METOCLOPRAMIDE HCL 5 MG/ML IJ SOLN
5.0000 mg | Freq: Once | INTRAMUSCULAR | Status: AC
  Administered 2022-03-09: 5 mg via INTRAVENOUS
  Filled 2022-03-09: qty 2

## 2022-03-09 MED ORDER — SODIUM CHLORIDE 0.9 % IV SOLN
1.0000 g | Freq: Once | INTRAVENOUS | Status: AC
  Administered 2022-03-09: 1 g via INTRAVENOUS
  Filled 2022-03-09: qty 10

## 2022-03-09 MED ORDER — SODIUM CHLORIDE 0.9 % IV BOLUS
1000.0000 mL | Freq: Once | INTRAVENOUS | Status: AC
  Administered 2022-03-09: 1000 mL via INTRAVENOUS

## 2022-03-09 MED ORDER — DIPHENHYDRAMINE HCL 50 MG/ML IJ SOLN
12.5000 mg | Freq: Once | INTRAMUSCULAR | Status: AC
  Administered 2022-03-09: 12.5 mg via INTRAVENOUS
  Filled 2022-03-09: qty 1

## 2022-03-09 MED ORDER — ONDANSETRON HCL 4 MG PO TABS: 4.0000 mg | ORAL_TABLET | ORAL | 0 refills | Status: AC | PRN

## 2022-03-09 MED ORDER — SUCRALFATE 1 G PO TABS: 1.0000 g | ORAL_TABLET | Freq: Three times a day (TID) | ORAL | 0 refills | Status: AC

## 2022-03-09 MED ORDER — FAMOTIDINE IN NACL 20-0.9 MG/50ML-% IV SOLN
20.0000 mg | Freq: Once | INTRAVENOUS | Status: AC
  Administered 2022-03-09: 20 mg via INTRAVENOUS
  Filled 2022-03-09: qty 50

## 2022-03-09 MED ORDER — IOHEXOL 300 MG/ML  SOLN
100.0000 mL | Freq: Once | INTRAMUSCULAR | Status: AC | PRN
  Administered 2022-03-09: 80 mL via INTRAVENOUS

## 2022-03-09 NOTE — ED Notes (Signed)
Patient consumed 200 ml of fluid PO seems to be tolerating it well. Nausea and vomiting have subsided.

## 2022-03-09 NOTE — ED Provider Notes (Signed)
[facility] EMERGENCY DEPARTMENT Provider Note   CSN: [id number] Arrival date & time: 03/08/22  2123     History  Chief Complaint  Patient presents with   Fever   Emesis    Jimmy Osborne is a 66 y.o. male.  Patient as above with significant medical history as below, including hernia repair x2, ankylosing spondylitis, kidney stones who presents to the ED with complaint of abd pain, n/v.  Onset 3-4 hours ago Spouse also with nausea / vomiting but she has gotten better Pt with fever yestd tmax 104 No rashes, no diarrhea or brbpr, no melena, does report reduced UOP last 24 hours  Limited po intake last 24-48 hours, will vomit shortly after any po intake Mild suprapubic pain, no sig dysuria, decreased frequency of urination More so abd cramping diffuse     Past Medical History:  Diagnosis Date   Ankylosing spondylitis (HCC)    Renal disorder    kidney stones    Past Surgical History:  Procedure Laterality Date   hernia x 2     Inguinal hernia and umblical hernia   lithotrypsy       The history is provided by the patient and the spouse.  Fever Associated symptoms: nausea and vomiting   Associated symptoms: no chest pain, no chills, no confusion, no cough, no headaches and no rash   Emesis Associated symptoms: abdominal pain and fever   Associated symptoms: no chills, no cough and no headaches        Home Medications Prior to Admission medications   Medication Sig Start Date End Date Taking? Authorizing Provider  cephALEXin (KEFLEX) 500 MG capsule Take 1 capsule (500 mg total) by mouth 3 (three) times daily for 12 days. 03/09/22 03/21/22 Yes Tanda Rockers A, DO  ondansetron (ZOFRAN) 4 MG tablet Take 1 tablet (4 mg total) by mouth every 4 (four) hours as needed for nausea or vomiting. 03/09/22  Yes Tanda Rockers A, DO  sucralfate (CARAFATE) 1 g tablet Take 1 tablet (1 g total) by mouth with breakfast, with lunch, and with evening meal for 7 days. 03/09/22 03/16/22 Yes  Tanda Rockers A, DO  cholecalciferol (VITAMIN D) 1000 units tablet Take by mouth daily.    [provider]  Fish Oil-Cholecalciferol (FISH OIL + D3 PO) Take 2 capsules by mouth daily.    [provider]  folic acid (FOLVITE) 1 MG tablet Take 1 tablet (1 mg total) by mouth daily. 04/03/15   Philip Aspen, Limmie Patricia, MD  Multiple Vitamin (MULTIVITAMIN WITH MINERALS) TABS tablet Take 1 tablet by mouth daily.    [provider]  omeprazole (PRILOSEC) 40 MG capsule Take 1 capsule (40 mg total) by mouth daily. 10/24/17   Setzer, Brand Males, NP  vitamin B-12 (CYANOCOBALAMIN) 100 MCG tablet Take 100 mcg by mouth daily.    [provider]  vitamin C (ASCORBIC ACID) 500 MG tablet Take 500 mg by mouth daily.    [provider]      Allergies    Fentanyl    Review of Systems   Review of Systems  Constitutional:  Positive for fatigue and fever. Negative for chills.  HENT:  Negative for facial swelling and trouble swallowing.   Eyes:  Negative for photophobia and visual disturbance.  Respiratory:  Negative for cough and shortness of breath.   Cardiovascular:  Negative for chest pain and palpitations.  Gastrointestinal:  Positive for abdominal pain, nausea and vomiting.  Endocrine: Negative for polydipsia and  polyuria.  Genitourinary:  Negative for difficulty urinating and hematuria.  Musculoskeletal:  Negative for gait problem and joint swelling.  Skin:  Negative for pallor and rash.  Neurological:  Negative for syncope and headaches.  Psychiatric/Behavioral:  Negative for agitation and confusion.     Physical Exam Updated Vital Signs BP 136/65 (BP Location: Left Arm)   Pulse 63   Temp 98.4 F (36.9 C) (Oral)   Resp 18   Ht 6' (1.829 m)   Wt 89.8 kg   SpO2 96%   BMI 26.85 kg/m  Physical Exam Vitals and nursing note reviewed.  Constitutional:      General: He is not in acute distress.    Appearance: Normal appearance. He is well-developed. He is  not ill-appearing, toxic-appearing or diaphoretic.  HENT:     Head: Normocephalic and atraumatic.     Right Ear: External ear normal.     Left Ear: External ear normal.     Mouth/Throat:     Mouth: Mucous membranes are moist.  Eyes:     General: No scleral icterus. Cardiovascular:     Rate and Rhythm: Normal rate and regular rhythm.     Pulses: Normal pulses.     Heart sounds: Normal heart sounds.  Pulmonary:     Effort: Pulmonary effort is normal. No respiratory distress.     Breath sounds: Normal breath sounds.  Abdominal:     General: Abdomen is flat.     Palpations: Abdomen is soft.     Tenderness: There is generalized abdominal tenderness. There is no right CVA tenderness, left CVA tenderness, guarding or rebound.     Comments: Not peritoneal   Musculoskeletal:        General: Normal range of motion.     Cervical back: Normal range of motion.     Right lower leg: No edema.     Left lower leg: No edema.  Skin:    General: Skin is warm and dry.     Capillary Refill: Capillary refill takes less than 2 seconds.  Neurological:     Mental Status: He is alert and oriented to person, place, and time.     GCS: GCS eye subscore is 4. GCS verbal subscore is 5. GCS motor subscore is 6.  Psychiatric:        Mood and Affect: Mood normal.        Behavior: Behavior normal.     ED Results / Procedures / Treatments   Labs (all labs ordered are listed, but only abnormal results are displayed) Labs Reviewed  CBC WITH DIFFERENTIAL/PLATELET - Abnormal; Notable for the following components:      Result Value   Hemoglobin 12.3 (*)    HCT 36.4 (*)    All other components within normal limits  COMPREHENSIVE METABOLIC PANEL - Abnormal; Notable for the following components:   Potassium 2.9 (*)    CO2 33 (*)    Glucose, Bld 126 (*)    Creatinine, Ser 1.41 (*)    GFR, Estimated 55 (*)    All other components within normal limits  URINALYSIS, ROUTINE W REFLEX MICROSCOPIC - Abnormal;  Notable for the following components:   APPearance HAZY (*)    Hgb urine dipstick LARGE (*)    Ketones, ur 20 (*)    Protein, ur 100 (*)    RBC / HPF >50 (*)    Bacteria, UA RARE (*)    All other components within normal limits  RESP PANEL BY RT-PCR (  RSV, FLU A&B, COVID)  [id number]  URINE CULTURE  LIPASE, BLOOD    EKG None  Radiology CT ABDOMEN PELVIS W CONTRAST  Result Date: 03/09/2022 CLINICAL DATA:  Acute abdominal pain with fever. EXAM: CT ABDOMEN AND PELVIS WITH CONTRAST TECHNIQUE: Multidetector CT imaging of the abdomen and pelvis was performed using the standard protocol following bolus administration of intravenous contrast. RADIATION DOSE REDUCTION: This exam was performed according to the departmental dose-optimization program which includes automated exposure control, adjustment of the mA and/or kV according to patient size and/or use of iterative reconstruction technique. CONTRAST:  61mL OMNIPAQUE IOHEXOL 300 MG/ML  SOLN COMPARISON:  None Available. FINDINGS: Lower chest: No acute abnormality. Hepatobiliary: No focal liver abnormality is seen. No gallstones, gallbladder wall thickening, or biliary dilatation. Pancreas: Unremarkable. No pancreatic ductal dilatation or surrounding inflammatory changes. Spleen: Normal in size without focal abnormality. Adrenals/Urinary Tract: There are left renal cysts measuring up to 1.6 cm. There are calculi in the lower pole the left kidney measuring up to 9 mm. There is no hydronephrosis in either kidney. The adrenal glands and bladder are within normal limits. Stomach/Bowel: Stomach is within normal limits. Appendix appears normal. No evidence of bowel wall thickening, distention, or inflammatory changes. There is sigmoid colon diverticulosis. Vascular/Lymphatic: Aortic atherosclerosis. No enlarged abdominal or pelvic lymph nodes. Reproductive: Prostate is unremarkable. Other: There are small fat containing inguinal hernias. There is no ascites.  Musculoskeletal: No acute osseous findings. Degenerative changes affect the spine. IMPRESSION: 1. No acute localizing process in the abdomen or pelvis. 2. Nonobstructing left renal calculi. 3. Sigmoid colon diverticulosis. Aortic Atherosclerosis (ICD10-I70.0). Electronically Signed   By: Darliss Cheney M.D.   On: 03/09/2022 01:21    Procedures Procedures    Medications Ordered in ED Medications  cefTRIAXone (ROCEPHIN) 1 g in sodium chloride 0.9 % 100 mL IVPB (1 g Intravenous New Bag/Given 03/09/22 0304)  ondansetron (ZOFRAN-ODT) disintegrating tablet 8 mg (8 mg Oral Given 03/08/22 2209)  ondansetron (ZOFRAN-ODT) 8 MG disintegrating tablet (  Return to [facility] 03/08/22 2305)  sodium chloride 0.9 % bolus 1,000 mL (0 mLs Intravenous Stopped 03/09/22 0145)  famotidine (PEPCID) IVPB 20 mg premix (0 mg Intravenous Stopped 03/09/22 0148)  sodium chloride 0.9 % bolus 1,000 mL (0 mLs Intravenous Stopped 03/09/22 0242)  potassium chloride SA (KLOR-CON M) CR tablet 40 mEq (40 mEq Oral Given 03/09/22 0123)  metoCLOPramide (REGLAN) injection 5 mg (5 mg Intravenous Given 03/09/22 0124)  diphenhydrAMINE (BENADRYL) injection 12.5 mg (12.5 mg Intravenous Given 03/09/22 0122)  iohexol (OMNIPAQUE) 300 MG/ML solution 100 mL (80 mLs Intravenous Contrast Given 03/09/22 0112)    ED Course/ Medical Decision Making/ A&P                           Medical Decision Making Amount and/or Complexity of Data Reviewed Labs: ordered. Radiology: ordered.  Risk Prescription drug management.   This patient presents to the ED with chief complaint(s) of n/v with pertinent past medical history of as above which further complicates the presenting complaint. The complaint involves an extensive differential diagnosis and also carries with it a high risk of complications and morbidity.    Differential diagnosis includes but is not exclusive to acute cholecystitis, intrathoracic causes for epigastric abdominal pain, gastritis,  duodenitis, pancreatitis, small bowel or large bowel obstruction, abdominal aortic aneurysm, hernia, gastritis, etc.  Differential diagnosis includes but is not exclusive to acute appendicitis, renal colic, testicular torsion, urinary tract infection, prostatitis,  diverticulitis, small bowel obstruction, colitis, abdominal aortic aneurysm, gastroenteritis, constipation etc.  . Serious etiologies were considered.   The initial plan is to screening labs, imaging, ivf, symptomatic relief, ct ap re-assess   Additional history obtained: Additional history obtained from spouse Records reviewed Care Everywhere/External Records, Primary Care Documents, and prior ed visits, home medications, prior labs/imaging  Independent labs interpretation:  The following labs were independently interpreted:  Metabolic panel with mild elev to Cr from baseline, K+ is also low, give IVF and oral k+ replacement - baseline Cr around 1 CBC stable, hgb similar to baseline UA with ? Uti, given his suprapubic pain, n/v, reported fever at home will go ahead and treat for uti vs pyelo, no cva ttp or ct evidence of pyelo Rvp neg   Independent visualization of imaging: - I independently visualized the following imaging with scope of interpretation limited to determining acute life threatening conditions related to emergency care: ctap, which revealed diverticulosis, non obstructing left renal calculi, o/w no acute process  Cardiac monitoring was reviewed and interpreted by myself which shows na  Treatment and Reassessment: Gi cocktail Anti emetics 2L IVF K+ replacement >> greatly improved, able to tolerate PO intake, feeling significantly better  Consultation: - Consulted or discussed management/test interpretation w/ external professional: na  Consideration for admission or further workup: Admission was considered    Patient presents with vomiting, diarrhea, and abdominal cramping. Sx suggestive of enteritis  or food born illness. Surgical or other more serious etiology appears very unlikely. The patient is improved with ED treatment. His UA is ?for UTI, given his reported fever, n/v will go ahead and treat, send urine culture. Will give 1x dose rocephin in ED prior to discharge   Will discharge with observation and symptomatic treatment. Abdominal pain warnings discussed.   The patient improved significantly and was discharged in stable condition. Detailed discussions were had with the patient regarding current findings, and need for close f/u with PCP or on call doctor. The patient has been instructed to return immediately if the symptoms worsen in any way for re-evaluation. Patient verbalized understanding and is in agreement with current care plan. All questions answered prior to discharge.    Social Determinants of health: Social History   Tobacco Use   Smoking status: Former    Types: Cigarettes   Smokeless tobacco: Never  Substance Use Topics   Alcohol use: No   Drug use: No            Final Clinical Impression(s) / ED Diagnoses Final diagnoses:  Diverticulosis  Abdominal pain, unspecified abdominal location  Nausea and vomiting, unspecified vomiting type  Hypokalemia  Urinary tract infection with hematuria, site unspecified  Mild dehydration    Rx / DC Orders ED Discharge Orders          Ordered    ondansetron (ZOFRAN) 4 MG tablet  Every 4 hours PRN        03/09/22 0251    sucralfate (CARAFATE) 1 g tablet  3 times daily with meals        03/09/22 0251    cephALEXin (KEFLEX) 500 MG capsule  3 times daily        03/09/22 0256              Sloan Leiter, DO 03/09/22 0310

## 2022-03-09 NOTE — Discharge Instructions (Addendum)
It was a pleasure caring for you today in the emergency department. ° °Please return to the emergency department for any worsening or worrisome symptoms. ° ° °

## 2022-03-10 LAB — URINE CULTURE: Culture: <10000 — AB

## 2022-03-28 DIAGNOSIS — F330 Major depressive disorder, recurrent, mild: Secondary | ICD-10-CM | POA: Diagnosis not present

## 2022-03-28 DIAGNOSIS — R4189 Other symptoms and signs involving cognitive functions and awareness: Secondary | ICD-10-CM | POA: Diagnosis not present

## 2022-03-28 DIAGNOSIS — Z125 Encounter for screening for malignant neoplasm of prostate: Secondary | ICD-10-CM | POA: Diagnosis not present

## 2022-03-28 DIAGNOSIS — I120 Hypertensive chronic kidney disease with stage 5 chronic kidney disease or end stage renal disease: Secondary | ICD-10-CM | POA: Diagnosis not present

## 2022-03-28 DIAGNOSIS — Z0000 Encounter for general adult medical examination without abnormal findings: Secondary | ICD-10-CM | POA: Diagnosis not present

## 2022-03-28 DIAGNOSIS — Z6827 Body mass index (BMI) 27.0-27.9, adult: Secondary | ICD-10-CM | POA: Diagnosis not present

## 2022-03-28 DIAGNOSIS — M5459 Other low back pain: Secondary | ICD-10-CM | POA: Diagnosis not present

## 2022-03-28 DIAGNOSIS — I10 Essential (primary) hypertension: Secondary | ICD-10-CM | POA: Diagnosis not present

## 2022-03-28 DIAGNOSIS — I82503 Chronic embolism and thrombosis of unspecified deep veins of lower extremity, bilateral: Secondary | ICD-10-CM | POA: Diagnosis not present

## 2022-04-09 DIAGNOSIS — F17210 Nicotine dependence, cigarettes, uncomplicated: Secondary | ICD-10-CM | POA: Diagnosis not present

## 2022-04-09 DIAGNOSIS — Z136 Encounter for screening for cardiovascular disorders: Secondary | ICD-10-CM | POA: Diagnosis not present

## 2022-04-09 DIAGNOSIS — Z87891 Personal history of nicotine dependence: Secondary | ICD-10-CM | POA: Diagnosis not present

## 2022-04-30 DIAGNOSIS — G8929 Other chronic pain: Secondary | ICD-10-CM | POA: Diagnosis not present

## 2022-05-08 DIAGNOSIS — Z1211 Encounter for screening for malignant neoplasm of colon: Secondary | ICD-10-CM | POA: Diagnosis not present

## 2022-05-24 DIAGNOSIS — M810 Age-related osteoporosis without current pathological fracture: Secondary | ICD-10-CM | POA: Diagnosis not present

## 2022-06-19 ENCOUNTER — Telehealth: Payer: Self-pay | Admitting: *Deleted

## 2022-06-19 NOTE — Progress Notes (Signed)
  Care Coordination   Note   06/19/2022 Name: Jimmy Osborne MRN: [id number] DOB: 07/23/1955  [name] is a 67 y.o. year old male who sees Hasanaj, Samul Dada, MD for primary care. I reached out to Frederica Kuster by phone today to offer care coordination services.  [name] was given information about Care Coordination services today including:   The Care Coordination services include support from the care team which includes your Nurse Coordinator, Clinical Social Worker, or Pharmacist.  The Care Coordination team is here to help remove barriers to the health concerns and goals most important to you. Care Coordination services are voluntary, and the patient may decline or stop services at any time by request to their care team member.   Care Coordination Consent Status: Patient agreed to services and verbal consent obtained.   Follow up plan:  Telephone appointment with care coordination team member scheduled for:  06/22/22  Encounter Outcome:  Pt. Scheduled  [place]  Direct Dial: [phone]

## 2022-06-22 ENCOUNTER — Ambulatory Visit: Payer: Self-pay | Admitting: *Deleted

## 2022-06-22 NOTE — Patient Outreach (Signed)
  Care Coordination   06/22/2022 Name: Jimmy Osborne MRN: [id number] DOB: Jun 09, 1955   Care Coordination Outreach Attempts:  An unsuccessful telephone outreach was attempted for a scheduled appointment today.  Follow Up Plan:  Additional outreach attempts will be made to offer the patient care coordination information and services. First unsuccessful outreach for initial visit.   Encounter Outcome:  No Answer. Left HIPAA compliant VM.   Care Coordination Interventions:  No, not indicated    Demetrios Loll, BSN, RN-BC RN Care Coordinator [facility]  [facility] Direct Dial: [phone] Main #: [phone]

## 2022-06-29 ENCOUNTER — Telehealth: Payer: Self-pay | Admitting: *Deleted

## 2022-06-29 NOTE — Progress Notes (Unsigned)
  Care Coordination Note  06/29/2022 Name: Jimmy Osborne MRN: [id number] DOB: May 15, 1955  [name] is a 67 y.o. year old male who is a primary care patient of Hasanaj, Myra Gianotti, MD and is actively engaged with the care management team. I reached out to Morene Rankins by phone today to assist with re-scheduling an initial visit with the RN Case Manager  Follow up plan: Unsuccessful telephone outreach attempt made. A HIPAA compliant phone message was left for the patient providing contact information and requesting a return call.   [facility]  Care Coordination Care Guide  Direct Dial: [phone]

## 2022-07-04 NOTE — Progress Notes (Signed)
  Care Coordination Note  07/04/2022 Name: Jimmy Osborne MRN: [id number] DOB: [birth year]  Jimmy Osborne is a 67 y.o. year old male who is a primary care patient of Hasanaj, Myra Gianotti, MD and is actively engaged with the care management team. I reached out to Morene Rankins by phone today to assist with re-scheduling an initial visit with the RN Case Manager  Follow up plan: Telephone appointment with care management team member scheduled for:07/06/22 [facility] Coordination Care Guide  Direct Dial: [phone]

## 2022-07-06 ENCOUNTER — Ambulatory Visit: Payer: Self-pay | Admitting: *Deleted

## 2022-07-06 ENCOUNTER — Encounter: Payer: Self-pay | Admitting: *Deleted

## 2022-07-06 NOTE — Patient Outreach (Signed)
  Care Coordination   Initial Visit Note   07/06/2022 Name: KALUB MORILLO MRN: [id number] DOB: February 12, 1956  Jonne Ply Buresh is a 67 y.o. year old male who sees Hasanaj, Myra Gianotti, MD for primary care. I spoke with  Morene Rankins by phone today.  What matters to the patients health and wellness today?  Ongoing self management of chronic medical conditions    Goals Addressed             This Visit's Progress    COMPLETED: Care Coordination Services (no follow-up required)       Care Coordination Goals: Patient will follow-up with PCP and/or specialist(s) as recommended Patient will take medications as prescribed Patient will reach out to [facility] Care Management Dept at [phone] with any care coordination or resource needs  Patient will utilize the [facility] 24 Hour Nurse/Concierge Line as needed [phone]         SDOH assessments and interventions completed:  Yes  SDOH Interventions Today    Flowsheet Row Most Recent Value  SDOH Interventions   Food Insecurity Interventions Intervention Not Indicated  Housing Interventions Intervention Not Indicated  Transportation Interventions Intervention Not Indicated  Utilities Interventions Intervention Not Indicated  Financial Strain Interventions Intervention Not Indicated        Care Coordination Interventions:  Yes, provided  Interventions Today    Flowsheet Row Most Recent Value  Chronic Disease   Chronic disease during today's visit Other  [Ankylosing spondylitis]  General Interventions   General Interventions Discussed/Reviewed General Interventions Discussed, General Interventions Reviewed, [facility], Doctor Visits, Horticulturist, commercial (DME), Labs, Health Screening  Doctor Visits Discussed/Reviewed Doctor Visits Discussed, Doctor Visits Reviewed, Specialist, PCP, Annual Wellness Visits  Health Screening Prostate, Bone Density  PCP/Specialist Visits Compliance with follow-up visit  Exercise Interventions    Exercise Discussed/Reviewed Exercise Discussed, Exercise Reviewed, Physical Activity  Physical Activity Discussed/Reviewed Physical Activity Discussed, Physical Activity Reviewed  Education Interventions   Education Provided Provided Education  Provided Verbal Education On Mental Health/Coping with Illness, When to see the doctor, Medication, Exercise, Community Resources, Nutrition, Labs  Mental Health Interventions   Mental Health Discussed/Reviewed Mental Health Discussed, Mental Health Reviewed  Nutrition Interventions   Nutrition Discussed/Reviewed Nutrition Discussed, Nutrition Reviewed  Pharmacy Interventions   Pharmacy Dicussed/Reviewed Medications and their functions, Medication Adherence  Safety Interventions   Safety Discussed/Reviewed Safety Discussed, Fall Risk, Home Safety, Safety Reviewed      Follow up plan: No further intervention required.   Encounter Outcome:  Pt. Visit Completed   Demetrios Loll, BSN, RN-BC RN Care Coordinator [facility]  [facility] Direct Dial: [phone] Main #: [phone]

## 2022-07-19 DIAGNOSIS — Z6827 Body mass index (BMI) 27.0-27.9, adult: Secondary | ICD-10-CM | POA: Diagnosis not present

## 2022-07-19 DIAGNOSIS — I82503 Chronic embolism and thrombosis of unspecified deep veins of lower extremity, bilateral: Secondary | ICD-10-CM | POA: Diagnosis not present

## 2022-07-19 DIAGNOSIS — N182 Chronic kidney disease, stage 2 (mild): Secondary | ICD-10-CM | POA: Diagnosis not present

## 2022-07-19 DIAGNOSIS — Z0000 Encounter for general adult medical examination without abnormal findings: Secondary | ICD-10-CM | POA: Diagnosis not present

## 2022-07-19 DIAGNOSIS — M5459 Other low back pain: Secondary | ICD-10-CM | POA: Diagnosis not present

## 2022-07-19 DIAGNOSIS — F330 Major depressive disorder, recurrent, mild: Secondary | ICD-10-CM | POA: Diagnosis not present

## 2022-07-19 DIAGNOSIS — I10 Essential (primary) hypertension: Secondary | ICD-10-CM | POA: Diagnosis not present

## 2022-07-23 DIAGNOSIS — G8929 Other chronic pain: Secondary | ICD-10-CM | POA: Diagnosis not present

## 2022-09-25 DIAGNOSIS — I10 Essential (primary) hypertension: Secondary | ICD-10-CM | POA: Diagnosis not present

## 2022-09-25 DIAGNOSIS — Z6827 Body mass index (BMI) 27.0-27.9, adult: Secondary | ICD-10-CM | POA: Diagnosis not present

## 2022-09-25 DIAGNOSIS — B029 Zoster without complications: Secondary | ICD-10-CM | POA: Diagnosis not present

## 2022-11-12 DIAGNOSIS — G8929 Other chronic pain: Secondary | ICD-10-CM | POA: Diagnosis not present

## 2023-03-29 DIAGNOSIS — I82503 Chronic embolism and thrombosis of unspecified deep veins of lower extremity, bilateral: Secondary | ICD-10-CM | POA: Diagnosis not present

## 2023-04-19 DIAGNOSIS — I82503 Chronic embolism and thrombosis of unspecified deep veins of lower extremity, bilateral: Secondary | ICD-10-CM | POA: Diagnosis not present

## 2023-04-29 DIAGNOSIS — G8929 Other chronic pain: Secondary | ICD-10-CM | POA: Diagnosis not present

## 2023-05-01 DIAGNOSIS — I82503 Chronic embolism and thrombosis of unspecified deep veins of lower extremity, bilateral: Secondary | ICD-10-CM | POA: Diagnosis not present

## 2023-05-01 DIAGNOSIS — M5459 Other low back pain: Secondary | ICD-10-CM | POA: Diagnosis not present

## 2023-05-01 DIAGNOSIS — N182 Chronic kidney disease, stage 2 (mild): Secondary | ICD-10-CM | POA: Diagnosis not present

## 2023-05-01 DIAGNOSIS — Z0000 Encounter for general adult medical examination without abnormal findings: Secondary | ICD-10-CM | POA: Diagnosis not present

## 2023-05-01 DIAGNOSIS — I10 Essential (primary) hypertension: Secondary | ICD-10-CM | POA: Diagnosis not present

## 2023-05-10 DIAGNOSIS — I82503 Chronic embolism and thrombosis of unspecified deep veins of lower extremity, bilateral: Secondary | ICD-10-CM | POA: Diagnosis not present

## 2023-05-31 DIAGNOSIS — I82503 Chronic embolism and thrombosis of unspecified deep veins of lower extremity, bilateral: Secondary | ICD-10-CM | POA: Diagnosis not present

## 2023-07-10 DIAGNOSIS — I82503 Chronic embolism and thrombosis of unspecified deep veins of lower extremity, bilateral: Secondary | ICD-10-CM | POA: Diagnosis not present

## 2023-07-19 DIAGNOSIS — I82503 Chronic embolism and thrombosis of unspecified deep veins of lower extremity, bilateral: Secondary | ICD-10-CM | POA: Diagnosis not present

## 2023-08-16 DIAGNOSIS — I82503 Chronic embolism and thrombosis of unspecified deep veins of lower extremity, bilateral: Secondary | ICD-10-CM | POA: Diagnosis not present

## 2023-08-21 DIAGNOSIS — N182 Chronic kidney disease, stage 2 (mild): Secondary | ICD-10-CM | POA: Diagnosis not present

## 2023-08-21 DIAGNOSIS — I82503 Chronic embolism and thrombosis of unspecified deep veins of lower extremity, bilateral: Secondary | ICD-10-CM | POA: Diagnosis not present

## 2023-08-21 DIAGNOSIS — M1711 Unilateral primary osteoarthritis, right knee: Secondary | ICD-10-CM | POA: Diagnosis not present

## 2023-08-21 DIAGNOSIS — M5459 Other low back pain: Secondary | ICD-10-CM | POA: Diagnosis not present

## 2023-08-21 DIAGNOSIS — Z6827 Body mass index (BMI) 27.0-27.9, adult: Secondary | ICD-10-CM | POA: Diagnosis not present

## 2023-08-21 DIAGNOSIS — I10 Essential (primary) hypertension: Secondary | ICD-10-CM | POA: Diagnosis not present

## 2023-08-21 DIAGNOSIS — Z0000 Encounter for general adult medical examination without abnormal findings: Secondary | ICD-10-CM | POA: Diagnosis not present

## 2023-08-21 DIAGNOSIS — F330 Major depressive disorder, recurrent, mild: Secondary | ICD-10-CM | POA: Diagnosis not present

## 2023-09-06 DIAGNOSIS — I82503 Chronic embolism and thrombosis of unspecified deep veins of lower extremity, bilateral: Secondary | ICD-10-CM | POA: Diagnosis not present

## 2023-10-02 DIAGNOSIS — I82503 Chronic embolism and thrombosis of unspecified deep veins of lower extremity, bilateral: Secondary | ICD-10-CM | POA: Diagnosis not present

## 2023-10-15 DIAGNOSIS — I82503 Chronic embolism and thrombosis of unspecified deep veins of lower extremity, bilateral: Secondary | ICD-10-CM | POA: Diagnosis not present

## 2023-10-31 DIAGNOSIS — I82503 Chronic embolism and thrombosis of unspecified deep veins of lower extremity, bilateral: Secondary | ICD-10-CM | POA: Diagnosis not present

## 2023-11-25 DIAGNOSIS — I82503 Chronic embolism and thrombosis of unspecified deep veins of lower extremity, bilateral: Secondary | ICD-10-CM | POA: Diagnosis not present

## 2024-01-22 DIAGNOSIS — I82503 Chronic embolism and thrombosis of unspecified deep veins of lower extremity, bilateral: Secondary | ICD-10-CM | POA: Diagnosis not present

## 2024-01-22 DIAGNOSIS — Z6826 Body mass index (BMI) 26.0-26.9, adult: Secondary | ICD-10-CM | POA: Diagnosis not present

## 2024-01-22 DIAGNOSIS — I10 Essential (primary) hypertension: Secondary | ICD-10-CM | POA: Diagnosis not present

## 2024-01-22 DIAGNOSIS — N182 Chronic kidney disease, stage 2 (mild): Secondary | ICD-10-CM | POA: Diagnosis not present

## 2024-01-22 DIAGNOSIS — F330 Major depressive disorder, recurrent, mild: Secondary | ICD-10-CM | POA: Diagnosis not present

## 2024-01-22 DIAGNOSIS — M1711 Unilateral primary osteoarthritis, right knee: Secondary | ICD-10-CM | POA: Diagnosis not present

## 2024-01-22 DIAGNOSIS — M5459 Other low back pain: Secondary | ICD-10-CM | POA: Diagnosis not present

## 2024-01-30 ENCOUNTER — Encounter (INDEPENDENT_AMBULATORY_CARE_PROVIDER_SITE_OTHER): Payer: Self-pay | Admitting: *Deleted

## 2024-02-19 DIAGNOSIS — I82503 Chronic embolism and thrombosis of unspecified deep veins of lower extremity, bilateral: Secondary | ICD-10-CM | POA: Diagnosis not present
